# Patient Record
Sex: Female | Born: 1985 | Race: White | Hispanic: No | Marital: Married | Smoking: Former smoker
Health system: Southern US, Community
[De-identification: ages and names within clinical notes are randomized; demographics above are authoritative.]

## PROBLEM LIST (undated history)

## (undated) ENCOUNTER — Inpatient Hospital Stay (HOSPITAL_COMMUNITY): Payer: Self-pay

## (undated) DIAGNOSIS — M79605 Pain in left leg: Secondary | ICD-10-CM

## (undated) DIAGNOSIS — D649 Anemia, unspecified: Secondary | ICD-10-CM

## (undated) DIAGNOSIS — G43909 Migraine, unspecified, not intractable, without status migrainosus: Secondary | ICD-10-CM

## (undated) DIAGNOSIS — F32A Depression, unspecified: Secondary | ICD-10-CM

## (undated) DIAGNOSIS — R200 Anesthesia of skin: Secondary | ICD-10-CM

## (undated) DIAGNOSIS — H9190 Unspecified hearing loss, unspecified ear: Secondary | ICD-10-CM

## (undated) DIAGNOSIS — A749 Chlamydial infection, unspecified: Secondary | ICD-10-CM

## (undated) DIAGNOSIS — M545 Low back pain, unspecified: Secondary | ICD-10-CM

## (undated) DIAGNOSIS — F329 Major depressive disorder, single episode, unspecified: Secondary | ICD-10-CM

## (undated) DIAGNOSIS — F909 Attention-deficit hyperactivity disorder, unspecified type: Secondary | ICD-10-CM

## (undated) DIAGNOSIS — R87629 Unspecified abnormal cytological findings in specimens from vagina: Secondary | ICD-10-CM

## (undated) DIAGNOSIS — F419 Anxiety disorder, unspecified: Secondary | ICD-10-CM

## (undated) HISTORY — DX: Pain in left leg: M79.605

## (undated) HISTORY — DX: Unspecified abnormal cytological findings in specimens from vagina: R87.629

## (undated) HISTORY — PX: ADENOIDECTOMY, TONSILLECTOMY AND MYRINGOTOMY WITH TUBE PLACEMENT: SHX5716

## (undated) HISTORY — PX: HERNIA REPAIR: SHX51

## (undated) HISTORY — DX: Chlamydial infection, unspecified: A74.9

## (undated) HISTORY — DX: Migraine, unspecified, not intractable, without status migrainosus: G43.909

## (undated) HISTORY — DX: Attention-deficit hyperactivity disorder, unspecified type: F90.9

## (undated) HISTORY — PX: WISDOM TOOTH EXTRACTION: SHX21

## (undated) HISTORY — DX: Low back pain, unspecified: M54.50

## (undated) HISTORY — DX: Anemia, unspecified: D64.9

## (undated) HISTORY — PX: TONSILLECTOMY: SUR1361

## (undated) HISTORY — DX: Anesthesia of skin: R20.0

## (undated) HISTORY — PX: LEEP: SHX91

---
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
## 2014-02-21 ENCOUNTER — Ambulatory Visit (HOSPITAL_BASED_OUTPATIENT_CLINIC_OR_DEPARTMENT_OTHER)
Admission: RE | Admit: 2014-02-21 | Discharge: 2014-02-21 | Disposition: A | Discharge status: Home / Self Care | Payer: 59 | Source: Ambulatory Visit | Attending: Osteopathic Medicine | Admitting: Osteopathic Medicine

## 2014-02-21 ENCOUNTER — Other Ambulatory Visit (HOSPITAL_BASED_OUTPATIENT_CLINIC_OR_DEPARTMENT_OTHER): Payer: Self-pay | Admitting: Osteopathic Medicine

## 2014-02-21 DIAGNOSIS — R1084 Generalized abdominal pain: Secondary | ICD-10-CM

## 2014-05-28 DIAGNOSIS — R87613 High grade squamous intraepithelial lesion on cytologic smear of cervix (HGSIL): Secondary | ICD-10-CM | POA: Insufficient documentation

## 2016-07-24 DIAGNOSIS — F5081 Binge eating disorder: Secondary | ICD-10-CM | POA: Insufficient documentation

## 2016-07-24 DIAGNOSIS — F50814 Binge eating disorder, in remission: Secondary | ICD-10-CM | POA: Insufficient documentation

## 2016-07-24 DIAGNOSIS — F988 Other specified behavioral and emotional disorders with onset usually occurring in childhood and adolescence: Secondary | ICD-10-CM | POA: Insufficient documentation

## 2016-07-24 DIAGNOSIS — F17200 Nicotine dependence, unspecified, uncomplicated: Secondary | ICD-10-CM | POA: Insufficient documentation

## 2017-01-28 LAB — OB RESULTS CONSOLE ANTIBODY SCREEN: Antibody Screen: NEGATIVE

## 2017-01-28 LAB — OB RESULTS CONSOLE ABO/RH: RH TYPE: POSITIVE

## 2017-01-28 LAB — OB RESULTS CONSOLE RUBELLA ANTIBODY, IGM: RUBELLA: IMMUNE

## 2017-01-28 LAB — OB RESULTS CONSOLE HIV ANTIBODY (ROUTINE TESTING): HIV: NONREACTIVE

## 2017-01-28 LAB — OB RESULTS CONSOLE GC/CHLAMYDIA
Chlamydia: NEGATIVE
Gonorrhea: NEGATIVE

## 2017-01-28 LAB — OB RESULTS CONSOLE HEPATITIS B SURFACE ANTIGEN: HEP B S AG: NEGATIVE

## 2017-01-28 LAB — OB RESULTS CONSOLE RPR: RPR: NONREACTIVE

## 2017-02-20 ENCOUNTER — Emergency Department (HOSPITAL_BASED_OUTPATIENT_CLINIC_OR_DEPARTMENT_OTHER)
Admission: EM | Admit: 2017-02-20 | Discharge: 2017-02-20 | Disposition: A | Discharge status: Home / Self Care | Payer: 59 | Attending: Emergency Medicine | Admitting: Emergency Medicine

## 2017-02-20 ENCOUNTER — Encounter (HOSPITAL_BASED_OUTPATIENT_CLINIC_OR_DEPARTMENT_OTHER): Payer: Self-pay

## 2017-02-20 DIAGNOSIS — M542 Cervicalgia: Secondary | ICD-10-CM | POA: Diagnosis not present

## 2017-02-20 DIAGNOSIS — O9989 Other specified diseases and conditions complicating pregnancy, childbirth and the puerperium: Secondary | ICD-10-CM | POA: Insufficient documentation

## 2017-02-20 DIAGNOSIS — Z3A12 12 weeks gestation of pregnancy: Secondary | ICD-10-CM | POA: Diagnosis not present

## 2017-02-20 DIAGNOSIS — Z87891 Personal history of nicotine dependence: Secondary | ICD-10-CM | POA: Diagnosis not present

## 2017-02-20 HISTORY — DX: Anxiety disorder, unspecified: F41.9

## 2017-02-20 MED ORDER — CEPHALEXIN 500 MG PO CAPS: 500.0000 mg | ORAL_CAPSULE | Freq: Four times a day (QID) | ORAL | 0 refills | Status: DC

## 2017-02-20 NOTE — Discharge Instructions (Signed)
Please read and follow all provided instructions.  Your diagnoses today include:  1. Neck pain    Tests performed today include:  Vital signs. See below for your results today.   Medications prescribed:   Keflex (cephalexin) - antibiotic  You have been prescribed an antibiotic medicine: take the entire course of medicine even if you are feeling better. Stopping early can cause the antibiotic not to work.   Tylenol - medication for pain  Take any prescribed medications only as directed.  Home care instructions:  Follow any educational materials contained in this packet.  Use warm compresses on your neck for discomfort and pain.  If you develop redness and warmth in the skin or recurrent swelling, start antibiotic and follow-up with your doctor.  Follow-up instructions: Please follow-up with your primary care provider as needed for further evaluation of your symptoms.   Return instructions:   Please return to the Emergency Department if you experience worsening symptoms.   Return with fever, headache, vomiting, worsening neck pain.   Please return if you have any other emergent concerns.  Additional Information:  Your vital signs today were: BP 113/70    Pulse 90    Temp 98.6 F (37 C) (Oral)    Resp 16    Ht 5\' 3"  (1.6 m)    Wt 79.8 kg (176 lb)    SpO2 98%    BMI 31.18 kg/m  If your blood pressure (BP) was elevated above 135/85 this visit, please have this repeated by your doctor within one month. --------------

## 2017-02-20 NOTE — ED Notes (Signed)
ED Provider at bedside. 

## 2017-02-20 NOTE — ED Triage Notes (Signed)
C/o pain to posterior neck x 1 week-"knot" to area x today-states she was seen by PCP and sent to ED due to "blood in my ear"-NAD-steady gait

## 2017-02-20 NOTE — ED Provider Notes (Signed)
[facility]   CSN: [id number] Arrival date & time: 02/20/17  1532     History   Chief Complaint Chief Complaint  Patient presents with  . Neck Pain    HPI Linda Wiggins is a 31 y.o. female.  Patient who is [redacted] weeks pregnant, 2 prior pregnancies with no history of bleeding complications -- presents with c/o lump on the back of her neck with associated bruising and tenderness. Pt first noticed this today. She has had preceding soreness and thought that she 'slept on it wrong'. She states that yesterday she had fever to 99.93F and an episode of vomiting. She called her GYN today who referred her to PCP. PCP referred her to ED based on noting blood in her R ear. She reports no ear pain or drainage. She uses Q-tips but only on the outside. No other trauma reported. The swelling on the back of the neck is improved and the bruising is now gone. She has continued neck pain with decreased range of motion due to pain. Patient otherwise denies any weakness, numbness, or tingling in her arms or legs. No vision change or hearing change. No headaches or confusion. Patient denies vaginal bleeding, hematuria, hematochezia. She denies other easy bruising or bleeding. Patient has taken Tylenol prior to arrival. Onset of symptoms acute. Course is gradually improving.      Past Medical History:  Diagnosis Date  . Anxiety     There are no active problems to display for this patient.   Past Surgical History:  Procedure Laterality Date  . ADENOIDECTOMY, TONSILLECTOMY AND MYRINGOTOMY WITH TUBE PLACEMENT    . HERNIA REPAIR    . TUBAL LIGATION      OB History    Gravida Para Term Preterm AB Living   1             SAB TAB Ectopic Multiple Live Births                   Home Medications    Prior to Admission medications   Medication Sig Start Date End Date Taking? Authorizing Provider  FLUoxetine HCl (PROZAC PO) Take by mouth.   Yes [provider]    cephALEXin (KEFLEX) 500 MG capsule Take 1 capsule (500 mg total) by mouth 4 (four) times daily. 02/20/17   Renne CriglerGeiple, Avyon Herendeen, PA-C    Family History No family history on file.  Social History Social History  Substance Use Topics  . Smoking status: Former Games developermoker  . Smokeless tobacco: Never Used  . Alcohol use No     Allergies   Tramadol   Review of Systems Review of Systems  Constitutional: Negative for chills and fever (No documented fever greater than 100.93F).  HENT: Negative for congestion, ear discharge, ear pain, hearing loss, rhinorrhea and sore throat.   Eyes: Negative for redness.  Respiratory: Negative for cough.   Cardiovascular: Negative for chest pain.  Gastrointestinal: Negative for abdominal pain, diarrhea, nausea and vomiting.  Genitourinary: Negative for dysuria.  Musculoskeletal: Positive for myalgias, neck pain and neck stiffness.  Skin: Positive for color change. Negative for rash.  Neurological: Negative for headaches.     Physical Exam Updated Vital Signs BP 113/70   Pulse 90   Temp 98.6 F (37 C) (Oral)   Resp 16   Ht 5\' 3"  (1.6 m)   Wt 79.8 kg (176 lb)   SpO2 98%   BMI 31.18 kg/m   Physical Exam  Constitutional: She appears  well-developed and well-nourished.  HENT:  Head: Normocephalic and atraumatic.  Right Ear: Hearing, tympanic membrane and external ear normal. Tympanic membrane is not perforated, not erythematous and not bulging.  Left Ear: Hearing, tympanic membrane, external ear and ear canal normal. Tympanic membrane is not perforated, not erythematous and not bulging.  Mouth/Throat: Oropharynx is clear and moist.  There is a small erosion noted to the posterior wall of the right ear canal without any active bleeding or drainage. No signs of otitis externa. No hemotympanum or other abnormalities with either eardrum.  Eyes: Conjunctivae are normal. Right eye exhibits no discharge. Left eye exhibits no discharge.  Neck: Normal range of  motion. Neck supple.  Cardiovascular: Normal rate, regular rhythm and normal heart sounds.   Pulmonary/Chest: Effort normal and breath sounds normal.  Abdominal: Soft. There is no tenderness.  Musculoskeletal:       Right shoulder: Normal.       Left shoulder: Normal.       Cervical back: She exhibits decreased range of motion, tenderness and edema (Minimal). She exhibits no bony tenderness and no swelling.       Thoracic back: Normal.       Lumbar back: Normal.       Back:  Lymphadenopathy:    She has no cervical adenopathy.  Neurological: She is alert.  Skin: Skin is warm and dry.  Psychiatric: She has a normal mood and affect.  Nursing note and vitals reviewed.    ED Treatments / Results  Labs (all labs ordered are listed, but only abnormal results are displayed) Labs Reviewed - No data to display  EKG  EKG Interpretation None       Radiology No results found.  Procedures Procedures (including critical care time)  Medications Ordered in ED Medications - No data to display   Initial Impression / Assessment and Plan / ED Course  I have reviewed the triage vital signs and the nursing notes.  Pertinent labs & imaging results that were available during my care of the patient were reviewed by me and considered in my medical decision making (see chart for details).     Patient seen and examined.   Vital signs reviewed and are as follows: BP 113/70   Pulse 90   Temp 98.6 F (37 C) (Oral)   Resp 16   Ht 5\' 3"  (1.6 m)   Wt 79.8 kg (176 lb)   SpO2 98%   BMI 31.18 kg/m   Discussed with patient and husband that given current objective findings, do not feel extensive workup is indicated at this point.  At time of exam, no signs of cellulitis or abscess. I do recommend that the patient use warm compress or heating pad, Tylenol for irritation of the paraspinous musculature.  If she develops swelling or redness/warmth she is to start taking prescription of  Keflex. I do not feel that she needs to start this unless her symptoms worsen, as they currently appear to be improving based on a picture was taken earlier today.  Patient encouraged to follow-up with PCP/GYN as needed, especially if she develops other bleeding symptoms.  Encouraged return to the emergency department with any concerns. Patient and husband verbalized understanding and seem to be in agreement with the plan.  Final Clinical Impressions(s) / ED Diagnoses   Final diagnoses:  Neck pain   Patient with neck pain, skin changes, sent from PCP. At time of exam patient has tenderness but no significant skin findings to suggest  infection or abscess. Tenderness is suggestive of musculoskeletal irritation. She does show me a picture that demonstrated some bruising to the posterior neck that appears resolved. She does not have any other bleeding symptoms of her skin or elsewhere. She is in early 2nd trimester but do not suspect HELLP syndrome or other irregularity. Small erosion and ear canal suspect likely secondary to trauma. Do not suspect basilar skull fracture with minimal symptoms and lack of mechanism. Low suspicion for something threatening to the patient or her pregnancy at this time. We discussed signs and symptoms to return. Patient appears well, discharge. Symptomatic control indicated at this time.   New Prescriptions Discharge Medication List as of 02/20/2017  4:05 PM    START taking these medications   Details  cephALEXin (KEFLEX) 500 MG capsule Take 1 capsule (500 mg total) by mouth 4 (four) times daily., Starting Fri 02/20/2017, Print         Renne Crigler, PA-C 02/20/17 1624    Jerelyn Scott, MD 02/20/17 [phone]

## 2017-05-18 ENCOUNTER — Ambulatory Visit (INDEPENDENT_AMBULATORY_CARE_PROVIDER_SITE_OTHER): Payer: Medicaid Other | Admitting: Obstetrics & Gynecology

## 2017-05-18 ENCOUNTER — Encounter: Payer: Self-pay | Admitting: Obstetrics & Gynecology

## 2017-05-18 DIAGNOSIS — Z3482 Encounter for supervision of other normal pregnancy, second trimester: Secondary | ICD-10-CM

## 2017-05-18 DIAGNOSIS — Z3480 Encounter for supervision of other normal pregnancy, unspecified trimester: Secondary | ICD-10-CM

## 2017-05-18 NOTE — Progress Notes (Signed)
   PRENATAL VISIT NOTE  Subjective:  Linda Wiggins is a 31 y.o. G3P2002 at [redacted]w[redacted]d being seen today for ongoing prenatal care.  She is currently monitored for the following issues for this low-risk pregnancy and has Supervision of other normal pregnancy, antepartum on her problem list.  Patient reports selling.  Contractions: Not present. Vag. Bleeding: None.  Movement: Present. Denies leaking of fluid.   The following portions of the patient's history were reviewed and updated as appropriate: allergies, current medications, past family history, past medical history, past social history, past surgical history and problem list. Problem list updated.  Objective:   Vitals:   05/18/17 1419  BP: 120/70  Pulse: (!) 102  Weight: 191 lb (86.6 kg)    Fetal Status: Fetal Heart Rate (bpm): 146   Movement: Present     General:  Alert, oriented and cooperative. Patient is in no acute distress.  Skin: Skin is warm and dry. No rash noted.   Cardiovascular: Normal heart rate noted  Respiratory: Normal respiratory effort, no problems with respiration noted  Abdomen: Soft, gravid, appropriate for gestational age.  Pain/Pressure: Present     Pelvic: Cervical exam deferred        Extremities: Normal range of motion.  Edema: Mild pitting, slight indentation  Mental Status:  Normal mood and affect. Normal behavior. Normal judgment and thought content.   Assessment and Plan:  Pregnancy: G3P2002 at [redacted]w[redacted]d  1. Supervision of other normal pregnancy, antepartum Pt's BP is elevated for her although nml.  She feels like she is selling.  Can't wear rings.  Has headaches but has history of HA.  No HA today.  Worried that something is wrong.    2.  Undesired fertility BTL papers  3.  Incompete anatomy of heart F.U anatomy as planned by [facility]  Preterm labor symptoms and general obstetric precautions including but not limited to vaginal bleeding, contractions, leaking of fluid and fetal movement were  reviewed in detail with the patient. Please refer to After Visit Summary for other counseling recommendations.  No Follow-up on file.   Elsie Lincoln, MD

## 2017-05-19 ENCOUNTER — Telehealth: Payer: Self-pay

## 2017-05-19 NOTE — Telephone Encounter (Signed)
Pt called stating that her left arm has been numb for three weeks but, today she has also had a bad headache and her feet are swollen to the point that she cannot put her shoes on. I spoke with Lora Clark, RMariel Aloehe said to tell the pt to take some Tylenol, sit in a dark room and elevate her feet above the level of her heart. I told the pt to do that and told her to call us and let us know if it gets better and if it doesn't then we will bring her in the office to be seen. Pt stated that she did not have a way to check her BP at work but, she would do that and call us to let us know how she was feeling.

## 2017-05-20 ENCOUNTER — Encounter (HOSPITAL_COMMUNITY): Payer: Self-pay

## 2017-05-20 ENCOUNTER — Inpatient Hospital Stay (HOSPITAL_COMMUNITY)
Admission: AD | Admit: 2017-05-20 | Discharge: 2017-05-20 | Disposition: A | Discharge status: Home / Self Care | Payer: 59 | Source: Ambulatory Visit | Attending: Obstetrics and Gynecology | Admitting: Obstetrics and Gynecology

## 2017-05-20 DIAGNOSIS — O9989 Other specified diseases and conditions complicating pregnancy, childbirth and the puerperium: Secondary | ICD-10-CM

## 2017-05-20 DIAGNOSIS — R030 Elevated blood-pressure reading, without diagnosis of hypertension: Secondary | ICD-10-CM | POA: Diagnosis present

## 2017-05-20 DIAGNOSIS — Z3A25 25 weeks gestation of pregnancy: Secondary | ICD-10-CM | POA: Diagnosis not present

## 2017-05-20 DIAGNOSIS — R519 Headache, unspecified: Secondary | ICD-10-CM

## 2017-05-20 DIAGNOSIS — O26892 Other specified pregnancy related conditions, second trimester: Secondary | ICD-10-CM | POA: Diagnosis not present

## 2017-05-20 DIAGNOSIS — F17210 Nicotine dependence, cigarettes, uncomplicated: Secondary | ICD-10-CM | POA: Diagnosis not present

## 2017-05-20 DIAGNOSIS — O99332 Smoking (tobacco) complicating pregnancy, second trimester: Secondary | ICD-10-CM | POA: Diagnosis not present

## 2017-05-20 DIAGNOSIS — R51 Headache: Secondary | ICD-10-CM

## 2017-05-20 LAB — URINALYSIS, ROUTINE W REFLEX MICROSCOPIC
BILIRUBIN URINE: NEGATIVE
GLUCOSE, UA: NEGATIVE mg/dL
Hgb urine dipstick: NEGATIVE
Ketones, ur: NEGATIVE mg/dL
LEUKOCYTES UA: NEGATIVE
Nitrite: NEGATIVE
PH: 7 (ref 5.0–8.0)
Protein, ur: NEGATIVE mg/dL
Specific Gravity, Urine: 1.003 — ABNORMAL LOW (ref 1.005–1.030)

## 2017-05-20 NOTE — Progress Notes (Addendum)
G3P2 @ 25.[redacted] wksga. Presents to [facility] VSS see flow sheet for details. Denies LOF or bleeding. Pt c/o right abd pain that's been evaluated by provider on Monday and denies no new pain. Denies Ha, rib pain. Noted slight swelling of ankles that's been ongoing and nothing new for the patient. EFM applied.   Hx of 2 SVD no issues.   Provider at bs assessing pt. [facility]   1950: Pt declines lab work and wants to go home due to normal bp's. Provider made aware and at bs to reassess.  2003: Discharge instructions given with pt understanding. Pt left unit via ambulatory with SO

## 2017-05-20 NOTE — MAU Note (Signed)
Mon, was at the office, BP up- watching for pre-eclampsia. Today has been running 140/90's. Has a HA (for a wk, has not taken anything), RUQ pain started 3 days ago, visual changes, left arm is going numb. never had problems with BP before.

## 2017-05-20 NOTE — MAU Provider Note (Signed)
History     CSN: [id number]  Arrival date and time: 05/20/17 1842   None     Chief Complaint  Patient presents with  . Hypertension  . Headache  . Blurred Vision   31 yo G3P2002 at [redacted]w[redacted]d presents today because she had elevated blood pressure reading at [facility] today which was 141/77. Patient is very concerned that she has preeclampsia. She admits to headache, RUQ pain, and blurry vision. She was seen in office on Monday and had normal blood pressure at that time.     OB History    Gravida Para Term Preterm AB Living   3 2 2     2    SAB TAB Ectopic Multiple Live Births                  Past Medical History:  Diagnosis Date  . Anemia   . Anxiety   . Chlamydia   . Migraines   . Vaginal Pap smear, abnormal     Past Surgical History:  Procedure Laterality Date  . ADENOIDECTOMY, TONSILLECTOMY AND MYRINGOTOMY WITH TUBE PLACEMENT    . HERNIA REPAIR    . LEEP    . TONSILLECTOMY      Family History  Problem Relation Age of Onset  . Cervical cancer Mother   . Heart murmur Father   . Skin cancer Maternal Grandmother   . Skin cancer Maternal Grandfather   . Skin cancer Paternal Grandmother   . Skin cancer Paternal Grandfather   . Heart disease Paternal Grandfather     Social History  Substance Use Topics  . Smoking status: Light Tobacco Smoker    Packs/day: 0.25    Types: Cigarettes  . Smokeless tobacco: Never Used  . Alcohol use No     Comment: Not while pregnant    Allergies:  Allergies  Allergen Reactions  . Tramadol     Prescriptions Prior to Admission  Medication Sig Dispense Refill Last Dose  . FLUoxetine (PROZAC) 20 MG capsule   5 Taking  . FLUoxetine HCl (PROZAC PO) Take by mouth.   Not Taking  . ondansetron (ZOFRAN) 4 MG tablet Take 4 mg by mouth.   Not Taking  . Pediatric Multivit-Minerals-C (FLINTSTONES GUMMIES PLUS) CHEW Chew by mouth.   Taking  . sertraline (ZOLOFT) 50 MG tablet Take 50 mg by mouth.   Not Taking    Review of Systems   Constitutional: Negative for chills and fever.  HENT: Negative for congestion and ear discharge.   Eyes: Negative for discharge and itching.  Respiratory: Negative for chest tightness and shortness of breath.   Gastrointestinal: Positive for abdominal distention. Negative for vomiting.  Genitourinary: Negative for dysuria and flank pain.  Musculoskeletal: Negative for back pain and neck pain.  Neurological: Positive for headaches.   Physical Exam   Blood pressure 137/69, pulse (!) 103, temperature 98.5 F (36.9 C), temperature source Oral, resp. rate 18, height 5\' 3"  (1.6 m), weight 193 lb (87.5 kg), last menstrual period 11/25/2016, SpO2 98 %.  Physical Exam  Constitutional: She is oriented to person, place, and time. She appears well-developed and well-nourished. No distress.  HENT:  Head: Normocephalic and atraumatic.  Eyes: Pupils are equal, round, and reactive to light. Conjunctivae and EOM are normal.  Neck: Normal range of motion. Neck supple.  Cardiovascular: Intact distal pulses.   Respiratory: Effort normal. She has no wheezes.  GI: Soft. There is no tenderness.  Musculoskeletal: Normal range of motion. She exhibits no  edema.  Neurological: She is alert and oriented to person, place, and time.  Skin: Skin is warm and dry.  Psychiatric: She has a normal mood and affect. Her behavior is normal.    MAU Course  Procedures  MDM Blood pressure is normal today. I offered to perform lab studies to ease patient's mind. She declines labs today. Counseled her extensively about preeclampsia.  Assessment and Plan  1. Pregnancy at 25 weeks- follow up outpatient 2. Normal blood pressure in pregnancy- follow up outpatient.  Chubb Corporationmber Frazier Balfour 05/20/2017, 7:44 PM

## 2017-05-20 NOTE — Discharge Instructions (Signed)

## 2017-05-25 ENCOUNTER — Ambulatory Visit (INDEPENDENT_AMBULATORY_CARE_PROVIDER_SITE_OTHER): Payer: 59 | Admitting: *Deleted

## 2017-05-25 ENCOUNTER — Encounter: Payer: Self-pay | Admitting: *Deleted

## 2017-05-25 VITALS — BP 119/72 | HR 112

## 2017-05-25 DIAGNOSIS — Z3480 Encounter for supervision of other normal pregnancy, unspecified trimester: Secondary | ICD-10-CM

## 2017-05-25 NOTE — Progress Notes (Signed)
Pt here for BP check only  112/78

## 2017-06-01 ENCOUNTER — Ambulatory Visit (INDEPENDENT_AMBULATORY_CARE_PROVIDER_SITE_OTHER): Payer: 59 | Admitting: Advanced Practice Midwife

## 2017-06-01 ENCOUNTER — Ambulatory Visit (HOSPITAL_COMMUNITY)
Admission: RE | Admit: 2017-06-01 | Discharge: 2017-06-01 | Disposition: A | Discharge status: Home / Self Care | Payer: 59 | Source: Ambulatory Visit | Attending: Obstetrics & Gynecology | Admitting: Obstetrics & Gynecology

## 2017-06-01 ENCOUNTER — Encounter: Payer: Self-pay | Admitting: Advanced Practice Midwife

## 2017-06-01 ENCOUNTER — Other Ambulatory Visit: Payer: Self-pay | Admitting: Obstetrics & Gynecology

## 2017-06-01 VITALS — BP 128/76 | HR 97 | Wt 194.0 lb

## 2017-06-01 DIAGNOSIS — Z3A26 26 weeks gestation of pregnancy: Secondary | ICD-10-CM

## 2017-06-01 DIAGNOSIS — Z3480 Encounter for supervision of other normal pregnancy, unspecified trimester: Secondary | ICD-10-CM

## 2017-06-01 DIAGNOSIS — Z3689 Encounter for other specified antenatal screening: Secondary | ICD-10-CM

## 2017-06-01 DIAGNOSIS — Z23 Encounter for immunization: Secondary | ICD-10-CM | POA: Diagnosis not present

## 2017-06-01 DIAGNOSIS — Z3482 Encounter for supervision of other normal pregnancy, second trimester: Secondary | ICD-10-CM

## 2017-06-01 DIAGNOSIS — Z9889 Other specified postprocedural states: Secondary | ICD-10-CM

## 2017-06-01 DIAGNOSIS — R87613 High grade squamous intraepithelial lesion on cytologic smear of cervix (HGSIL): Secondary | ICD-10-CM

## 2017-06-01 DIAGNOSIS — Z3490 Encounter for supervision of normal pregnancy, unspecified, unspecified trimester: Secondary | ICD-10-CM

## 2017-06-01 NOTE — Patient Instructions (Signed)
Pregnancy and Influenza Influenza, also called the flu, is an infection of the respiratory tract. If you are pregnant, you are more likely to catch the flu. You are also more likely to have a more serious case of the flu. This is because pregnancy lowers your body's ability to fight off infections (it weakens your immune system). It also puts additional stress on your heart and lungs, which makes you more likely to have complications. Having a bad case of the flu, especially with a high fever, can be dangerous for your developing baby. It can cause you to go into early labor. How do people get the flu? The flu is caused by the influenza virus. This virus is common every year in the fall and winter. It spreads when virus particles get passed from person to person. You can get the virus if you are near a sick person who is coughing or sneezing. You can also get the virus if you touch something that has the virus on it and then touch your face. How can I protect myself against the flu?  Get a flu shot. The best way to prevent the flu is to get a flu shot before flu season starts. The flu shot is not dangerous for your developing baby. It may even help protect your baby from the flu for up to 6 months after birth. The flu shot is one type of flu vaccine. Another type is a nasal spray vaccine. Do not get the nasal spray vaccine. It is not approved for pregnancy.  Do not come in close contact with sick people.  Do not share food, drinks, or utensils with other people.  Wash your hands often. Use hand sanitizer when soap and water are not available. What should I do if I have flu symptoms? If you have any flu symptoms, call your health care provider right away. Flu symptoms include:  Fever or chills.  Muscle aches.  Headache.  Sore throat.  Nasal congestion.  Cough.  Feeling tired.  Loss of appetite.  Vomiting.  Diarrhea.  You may be able to take an antiviral medicine to keep the flu  from becoming severe and to shorten how long it lasts. What should I do at home if I am diagnosed with the flu?  Do not take any medicine, including cold or flu medicine, unless directed by your health care provider.  If you take antiviral medicine, make sure you finish it even if you start to feel better.  Drink enough fluid to keep your urine clear or pale yellow.  Get plenty of rest. When would I seek immediate medical care if I have the flu?  You have trouble breathing.  You have chest pain.  You begin to have labor pains.  You have a high fever that does not go down after you take medicine.  You do not feel your baby move.  You have diarrhea or vomiting that will not go away. This information is not intended to replace advice given to you by your health care provider. Make sure you discuss any questions you have with your health care provider. Document Released: 05/23/2008 Document Revised: 12/27/2015 Document Reviewed: 06/17/2013 Elsevier Interactive Patient Education  2017 [employer].   Preterm Labor and Birth Information The normal length of a pregnancy is 39-41 weeks. Preterm labor is when labor starts before 37 completed weeks of pregnancy. What are the risk factors for preterm labor? Preterm labor is more likely to occur in women who:  Have certain infections during pregnancy such as a bladder infection, sexually transmitted infection, or infection inside the uterus (chorioamnionitis).  Have a shorter-than-normal cervix.  Have gone into preterm labor before.  Have had surgery on their cervix.  Are younger than age 30 or older than age 30.  Are African American.  Are pregnant with twins or multiple babies (multiple gestation).  Take street drugs or smoke while pregnant.  Do not gain enough weight while pregnant.  Became pregnant shortly after having been pregnant.  What are the symptoms of preterm labor? Symptoms of preterm labor include:  Cramps  similar to those that can happen during a menstrual period. The cramps may happen with diarrhea.  Pain in the abdomen or lower back.  Regular uterine contractions that may feel like tightening of the abdomen.  A feeling of increased pressure in the pelvis.  Increased watery or bloody mucus discharge from the vagina.  Water breaking (ruptured amniotic sac).  Why is it important to recognize signs of preterm labor? It is important to recognize signs of preterm labor because babies who are born prematurely may not be fully developed. This can put them at an increased risk for:  Long-term (chronic) heart and lung problems.  Difficulty immediately after birth with regulating body systems, including blood sugar, body temperature, heart rate, and breathing rate.  Bleeding in the brain.  Cerebral palsy.  Learning difficulties.  Death.  These risks are highest for babies who are born before 34 weeks of pregnancy. How is preterm labor treated? Treatment depends on the length of your pregnancy, your condition, and the health of your baby. It may involve:  Having a stitch (suture) placed in your cervix to prevent your cervix from opening too early (cerclage).  Taking or being given medicines, such as: ? Hormone medicines. These may be given early in pregnancy to help support the pregnancy. ? Medicine to stop contractions. ? Medicines to help mature the baby's lungs. These may be prescribed if the risk of delivery is high. ? Medicines to prevent your baby from developing cerebral palsy.  If the labor happens before 34 weeks of pregnancy, you may need to stay in the hospital. What should I do if I think I am in preterm labor? If you think that you are going into preterm labor, call your health care provider right away. How can I prevent preterm labor in future pregnancies? To increase your chance of having a full-term pregnancy:  Do not use any tobacco products, such as cigarettes,  chewing tobacco, and e-cigarettes. If you need help quitting, ask your health care provider.  Do not use street drugs or medicines that have not been prescribed to you during your pregnancy.  Talk with your health care provider before taking any herbal supplements, even if you have been taking them regularly.  Make sure you gain a healthy amount of weight during your pregnancy.  Watch for infection. If you think that you might have an infection, get it checked right away.  Make sure to tell your health care provider if you have gone into preterm labor before.  This information is not intended to replace advice given to you by your health care provider. Make sure you discuss any questions you have with your health care provider. Document Released: 10/11/2003 Document Revised: 01/01/2016 Document Reviewed: 12/12/2015 Elsevier Interactive Patient Education  2018 [employer]

## 2017-06-01 NOTE — Progress Notes (Signed)
   PRENATAL VISIT NOTE  Subjective:  Linda Wiggins is a 31 y.o. G3P2002 at 3353w6d being seen today for ongoing prenatal care.  She is currently monitored for the following issues for this low-risk pregnancy and has Supervision of other normal pregnancy, antepartum on her problem list. Hx LEEP.   Patient reports fatigue. States she does not like being pregnant and is frustrated by low energy and weight gain. Contractions: Not present. Vag. Bleeding: None.  Movement: Present. Denies leaking of fluid.   The following portions of the patient's history were reviewed and updated as appropriate: allergies, current medications, past family history, past medical history, past social history, past surgical history and problem list. Problem list updated.  Objective:   Vitals:   06/01/17 0833  BP: 128/76  Pulse: 97  Weight: 194 lb (88 kg)    Fetal Status: Fetal Heart Rate (bpm): 141   Movement: Present     General:  Alert, oriented and cooperative. Patient is in no acute distress.  Psych Flat affect  Skin: Skin is warm and dry. No rash noted.   Cardiovascular: Normal heart rate noted  Respiratory: Normal respiratory effort, no problems with respiration noted  Abdomen: Soft, gravid, appropriate for gestational age.  Pain/Pressure: Present     Pelvic: Cervical exam deferred        Extremities: Normal range of motion.  Edema: Mild pitting, slight indentation  Mental Status:  Normal mood and affect. Normal behavior. Normal judgment and thought content.   Assessment and Plan:  Pregnancy: G3P2002 at 4653w6d  1. Encounter for supervision of normal pregnancy, antepartum, unspecified gravidity  - HIV antibody (with reflex) - CBC - RPR - 2Hr GTT w/ 1 Hr Carpenter 75 g - Tdap vaccine greater than or equal to 7yo IM  2. Needs flu shot  - Flu Vaccine QUAD 36+ mos IM  3. Supervision of other normal pregnancy, antepartum  4. GAD/?Depression  - Continue Prozac - Recommend developing a walking  routine to improve mood, decrease weight gain, improve energy. Pt doesn't think she has time. Suggested walking during kid's after school activities. Support given.   Preterm labor symptoms and general obstetric precautions including but not limited to vaginal bleeding, contractions, leaking of fluid and fetal movement were reviewed in detail with the patient. Please refer to After Visit Summary for other counseling recommendations.  F/U 2 weeks   Dorathy KinsmanVirginia Marciano Mundt, [place]

## 2017-06-02 LAB — 2HR GTT W 1 HR, CARPENTER, 75 G
GLUCOSE, 1 HR, GEST: 152 mg/dL (ref 65–179)
GLUCOSE, FASTING, GEST: 78 mg/dL (ref 65–91)
Glucose, 2 Hr, Gest: 124 mg/dL (ref 65–152)

## 2017-06-02 LAB — CBC
HCT: 29.5 % — ABNORMAL LOW (ref 35.0–45.0)
Hemoglobin: 10.1 g/dL — ABNORMAL LOW (ref 11.7–15.5)
MCH: 30.4 pg (ref 27.0–33.0)
MCHC: 34.2 g/dL (ref 32.0–36.0)
MCV: 88.9 fL (ref 80.0–100.0)
MPV: 8.9 fL (ref 7.5–12.5)
Platelets: 294 10*3/uL (ref 140–400)
RBC: 3.32 10*6/uL — ABNORMAL LOW (ref 3.80–5.10)
RDW: 12.4 % (ref 11.0–15.0)
WBC: 10.3 10*3/uL (ref 3.8–10.8)

## 2017-06-02 LAB — RPR: RPR: NONREACTIVE

## 2017-06-02 LAB — HIV ANTIBODY (ROUTINE TESTING W REFLEX): HIV: NONREACTIVE

## 2017-06-03 ENCOUNTER — Telehealth: Payer: Self-pay

## 2017-06-03 NOTE — Telephone Encounter (Signed)
Pt called stating that she has had contractions every 8-12 minutes for two hours. After speaking to Armandina StammerJennifer Howard, RN we told pt that we didn't have a doctor in the office right now and we think [facility] should [facility] and may need IV fluids. Pt stated that she would give it more time and if it didn't get better then she would go to [facility].

## 2017-06-04 ENCOUNTER — Emergency Department (INDEPENDENT_AMBULATORY_CARE_PROVIDER_SITE_OTHER)
Admission: EM | Admit: 2017-06-04 | Discharge: 2017-06-04 | Disposition: A | Discharge status: Home / Self Care | Payer: 59 | Source: Home / Self Care | Attending: Emergency Medicine | Admitting: Emergency Medicine

## 2017-06-04 ENCOUNTER — Encounter: Payer: Self-pay | Admitting: *Deleted

## 2017-06-04 ENCOUNTER — Telehealth: Payer: Self-pay

## 2017-06-04 DIAGNOSIS — J069 Acute upper respiratory infection, unspecified: Secondary | ICD-10-CM

## 2017-06-04 DIAGNOSIS — B9789 Other viral agents as the cause of diseases classified elsewhere: Secondary | ICD-10-CM

## 2017-06-04 DIAGNOSIS — J028 Acute pharyngitis due to other specified organisms: Principal | ICD-10-CM

## 2017-06-04 LAB — POCT INFLUENZA A/B
Influenza A, POC: NEGATIVE
Influenza B, POC: NEGATIVE

## 2017-06-04 LAB — POCT RAPID STREP A (OFFICE): Rapid Strep A Screen: NEGATIVE

## 2017-06-04 NOTE — Telephone Encounter (Signed)
Spoke with pt and she is aware that she passed her 2 hour GTT 

## 2017-06-04 NOTE — ED Provider Notes (Signed)
[facility]    CSN: [id number] Arrival date & time: 06/04/17  0912     History   Chief Complaint Chief Complaint  Patient presents with  . Cough    HPI Rajanae Mantia is a 31 y.o. female.   HPI Onset 24 hours ago. Nonproductive cough, fever to 100.5 without chills or sweats, Associated with severe sore throat, chest hurts when she coughs but no exertional chest pain. She states she feels this is a "cold", but because she is pregnant, [facility] 08/31/2017, she requests to be checked to see if any antibiotic or antiviral would be indicated at this time. She states pregnancy is going well. Her OB[facility] at [facility]. She smokes a half a pack a day and although I politely advised her to quit, and explain risks of not doing so, she states she is not willing to quit smoking at this time.  No history of recent tick bite. Has tried Tylenol, which helps to reduce fever and helps with pain.  Review of Systems: Positive for fatigue, mild nasal congestion, with clearish rhinorrhea with scant blood. No nosebleed. Positive for severe sore throat. Nonproductive cough. No shortness of her wheezing or exertional chest pain or shortness of breath.  Negative for acute vision changes, stiff neck, focal weakness, syncope, seizures, respiratory distress, abdominal pain, vomiting, diarrhea, GU symptoms, new rash.  Past Medical History:  Diagnosis Date  . Anemia   . Anxiety   . Chlamydia   . Migraines   . Vaginal Pap smear, abnormal     Patient Active Problem List   Diagnosis Date Noted  . H/O LEEP 06/01/2017  . Supervision of other normal pregnancy, antepartum 05/18/2017  . 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 Tobacco use disorder 07/24/2016  . Mild binge-eating disorder, in full remission 07/24/2016  . 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 ADENOIDECTOMY, TONSILLECTOMY AND  MYRINGOTOMY WITH TUBE PLACEMENT    . HERNIA REPAIR    . LEEP    . TONSILLECTOMY      OB History    Gravida Para Term Preterm AB Living   3 2 2     2    SAB TAB Ectopic Multiple Live Births                   Home Medications    Prior to Admission medications   Medication Sig Start Date End Date Taking? Authorizing Provider  FLUoxetine (PROZAC) 20 MG capsule  04/17/17   [provider]  ondansetron (ZOFRAN) 4 MG tablet Take 4 mg by mouth. 01/14/17 01/14/18  [provider]  Pediatric Multivit-Minerals-C (FLINTSTONES GUMMIES PLUS) CHEW Chew by mouth.    [provider]    Family History Family History  Problem Relation Age of Onset  . Cervical cancer Mother   . Heart murmur Father   . Skin cancer Maternal Grandmother   . Skin cancer Maternal Grandfather   . Skin cancer Paternal Grandmother   . Skin cancer Paternal Grandfather   . Heart disease Paternal Grandfather     Social History Social History  Substance Use Topics  . Smoking status: Current Every Day Smoker    Packs/day: 0.50    Types: Cigarettes  . Smokeless tobacco: Never Used  . Alcohol use No     Comment: Not while pregnant     Allergies   Tramadol   Review of Systems Review  of Systems  All other systems reviewed and are negative.    Physical Exam Triage Vital Signs ED Triage Vitals  Enc Vitals Group     BP 06/04/17 0932 109/73     Pulse Rate 06/04/17 0932 (!) 111     Resp 06/04/17 0932 18     Temp 06/04/17 0932 98.5 F (36.9 C)     Temp Source 06/04/17 0932 Oral     SpO2 06/04/17 0932 99 %     Weight 06/04/17 0932 196 lb (88.9 kg)     Height --      Head Circumference --      Peak Flow --      Pain Score 06/04/17 0933 4     Pain Loc --      Pain Edu? --      Excl. in GC? --    No data found.   Updated Vital Signs BP 109/73 (BP Location: Left Arm)   Pulse (!) 111   Temp 98.5 F (36.9 C) (Oral)   Resp 18   Wt 196 lb (88.9 kg)   LMP 11/25/2016   SpO2  99%   BMI 34.72 kg/m   Visual Acuity Right Eye Distance:   Left Eye Distance:   Bilateral Distance:    Right Eye Near:   Left Eye Near:    Bilateral Near:     Physical Exam  Constitutional: She appears well-developed and well-nourished. No distress.  Occasional nonproductive cough noted. No cardiorespiratory distress.  HENT:  Head: Normocephalic.  Oropharynx: Absent tonsils. Posterior pharynx moderately red without exudate. Airway intact Nose: Boggy nasal turbinates, mild serous drainage and congestion. No blood. Ears: TMs normal  Eyes: Conjunctivae are normal. Right eye exhibits no discharge. Left eye exhibits no discharge. No scleral icterus.  Neck: Neck supple. No JVD present.  Shoddy, minimally tender anterior cervical nodes  Cardiovascular: Regular rhythm and normal heart sounds.   No murmur heard. Pulmonary/Chest: Effort normal and breath sounds normal. She has no wheezes. She has no rales.  Abdominal: There is no tenderness.  Gravid  Musculoskeletal: Normal range of motion. She exhibits no edema.  Neurological: No cranial nerve deficit.  Skin: Skin is warm and dry. Capillary refill takes less than 2 seconds. No rash noted. She is not diaphoretic.  Psychiatric: She has a normal mood and affect.  Pulse repeated, 102   UC Treatments / Results  Labs (all labs ordered are listed, but only abnormal results are displayed) Labs Reviewed  POCT RAPID STREP A (OFFICE)  POCT INFLUENZA A/B   Rapid strep test negative EKG  EKG Interpretation None       Radiology No results found.  Procedures Procedures (including critical care time)  Medications Ordered in UC Medications - No data to display   Initial Impression / Assessment and Plan / UC Course  I have reviewed the triage vital signs and the nursing notes.  Pertinent labs & imaging results that were available during my care of patient were reviewed by me and considered in my medical decision making (see  chart for details).     Rapid strep test negative. Will send off strep culture. Rapid flu test is negative for influenza A and B We discussed, and I explained she likely has viral URI and sore throat. No evidence for bacterial infection. She agrees that no antiviral med or antibiotic indicated at this time.  We discussed symptomatic care with rest, vaporizer, push fluids. Tylenol if needed for pain or fever. Other  nonpharmacologic methods discussed. Advised her to always check with her OB/GYN for taking any medicines. Red flags and precautions discussed. If any worsening symptoms, follow-up here or OB/GYN or emergency room. She voiced understanding and agreement with above Final Clinical Impressions(s) / UC Diagnoses   Final diagnoses:  Viral URI with cough  Sore throat (viral)    New Prescriptions- none   Lajean ManesMassey, David, MD 06/04/17 1023

## 2017-06-04 NOTE — ED Triage Notes (Signed)
Patient c/o 2 days of non-productive cough, sore throat and chest pain with coughing. Taking Tylenol otc. Currently smoking 1/2 PPD. Currently 6 1/2 months gestation.

## 2017-06-05 ENCOUNTER — Telehealth: Payer: Self-pay | Admitting: *Deleted

## 2017-06-05 LAB — STREP A DNA PROBE: Group A Strep Probe: NOT DETECTED

## 2017-06-05 NOTE — Telephone Encounter (Signed)
Patient notified [facility]. Encouraged to treat symptomatically.

## 2017-06-15 ENCOUNTER — Ambulatory Visit (INDEPENDENT_AMBULATORY_CARE_PROVIDER_SITE_OTHER): Payer: 59 | Admitting: Obstetrics & Gynecology

## 2017-06-15 VITALS — BP 129/77 | HR 108 | Wt 200.0 lb

## 2017-06-15 DIAGNOSIS — G44221 Chronic tension-type headache, intractable: Secondary | ICD-10-CM | POA: Diagnosis not present

## 2017-06-15 DIAGNOSIS — Z3481 Encounter for supervision of other normal pregnancy, first trimester: Secondary | ICD-10-CM | POA: Diagnosis not present

## 2017-06-15 DIAGNOSIS — Z3480 Encounter for supervision of other normal pregnancy, unspecified trimester: Secondary | ICD-10-CM

## 2017-06-15 NOTE — Progress Notes (Signed)
Pt states that she has constant headaches & stomach pain everyday.     PRENATAL VISIT NOTE  Subjective:  Linda Wiggins is a 31 y.o. G3P2002 at 4424w6d being seen today for ongoing prenatal care.  She is currently monitored for the following issues for this low-risk pregnancy and has Supervision of other normal pregnancy, antepartum; Attention deficit disorder (ADD) without hyperactivity; HGSIL on cytologic smear of cervix; Tobacco use disorder; Mild binge-eating disorder, in full remission; and H/O LEEP on their problem list.  Patient reports continuous headache and pelvic pressure continuous..  Contractions: Irritability. Vag. Bleeding: None.  Movement: Present. Denies leaking of fluid.   The following portions of the patient's history were reviewed and updated as appropriate: allergies, current medications, past family history, past medical history, past social history, past surgical history and problem list. Problem list updated.  Objective:   Vitals:   06/15/17 1429  BP: 129/77  Pulse: (!) 108  Weight: 200 lb (90.7 kg)    Fetal Status: Fetal Heart Rate (bpm): 148 Fundal Height: 32 cm Movement: Present     General:  Alert, oriented and cooperative. Patient is in no acute distress.  Skin: Skin is warm and dry. No rash noted.   Cardiovascular: Normal heart rate noted  Respiratory: Normal respiratory effort, no problems with respiration noted  Abdomen: Soft, gravid, appropriate for gestational age.  Pain/Pressure: Present     Pelvic: Cervical exam performed Dilation: Closed Effacement (%): Thick Station: Ballotable  Extremities: Normal range of motion.  Edema: Mild pitting, slight indentation  Mental Status:  Normal mood and affect. Normal behavior. Normal judgment and thought content.   Assessment and Plan:  Pregnancy: G3P2002 at 7324w6d  1. Supervision of other normal pregnancy, antepartum -pain at pubic symphysis; no evidence of preterm labor--cervix closed  2.  Daily  headache Pt has frontal headaches above eyebrows Pt also had right next stiffness +muschle tension and knot superior to right scaula  Trigger point injection into muscle aove right scapula (at the heart of her skin tattoo) of 2 cc lidocaine/marcane 1:1 solution (no methylprednisilone available today).  Pt tolerated injection well Biofreeze Pregnancy massage Refer to karen Glyn Adeeague Clark or Dr. Catalina LungerHagan (pt may choose which provider)  Preterm labor symptoms and general obstetric precautions including but not limited to vaginal bleeding, contractions, leaking of fluid and fetal movement were reviewed in detail with the patient. Please refer to After Visit Summary for other counseling recommendations.  Return in about 2 weeks (around 06/29/2017).   Elsie LincolnKelly Rihanna Marseille, MD

## 2017-06-29 ENCOUNTER — Ambulatory Visit (INDEPENDENT_AMBULATORY_CARE_PROVIDER_SITE_OTHER): Payer: 59 | Admitting: Advanced Practice Midwife

## 2017-06-29 VITALS — BP 112/72 | HR 100 | Temp 97.6°F | Wt 202.0 lb

## 2017-06-29 DIAGNOSIS — J0111 Acute recurrent frontal sinusitis: Secondary | ICD-10-CM

## 2017-06-29 DIAGNOSIS — Z3480 Encounter for supervision of other normal pregnancy, unspecified trimester: Secondary | ICD-10-CM

## 2017-06-29 DIAGNOSIS — J069 Acute upper respiratory infection, unspecified: Secondary | ICD-10-CM

## 2017-06-29 DIAGNOSIS — O26843 Uterine size-date discrepancy, third trimester: Secondary | ICD-10-CM

## 2017-06-29 MED ORDER — AZITHROMYCIN 250 MG PO TABS: ORAL_TABLET | ORAL | 0 refills | Status: DC

## 2017-06-29 NOTE — Patient Instructions (Signed)
Third Trimester of Pregnancy The third trimester is from week 28 through week 40 (months 7 through 9). The third trimester is a time when the unborn baby (fetus) is growing rapidly. At the end of the ninth month, the fetus is about 20 inches in length and weighs 6-10 pounds. Body changes during your third trimester Your body will continue to go through many changes during pregnancy. The changes vary from woman to woman. During the third trimester:  Your weight will continue to increase. You can expect to gain 25-35 pounds (11-16 kg) by the end of the pregnancy.  You may begin to get stretch marks on your hips, abdomen, and breasts.  You may urinate more often because the fetus is moving lower into your pelvis and pressing on your bladder.  You may develop or continue to have heartburn. This is caused by increased hormones that slow down muscles in the digestive tract.  You may develop or continue to have constipation because increased hormones slow digestion and cause the muscles that push waste through your intestines to relax.  You may develop hemorrhoids. These are swollen veins (varicose veins) in the rectum that can itch or be painful.  You may develop swollen, bulging veins (varicose veins) in your legs.  You may have increased body aches in the pelvis, back, or thighs. This is due to weight gain and increased hormones that are relaxing your joints.  You may have changes in your hair. These can include thickening of your hair, rapid growth, and changes in texture. Some women also have hair loss during or after pregnancy, or hair that feels dry or thin. Your hair will most likely return to normal after your baby is born.  Your breasts will continue to grow and they will continue to become tender. A yellow fluid (colostrum) may leak from your breasts. This is the first milk you are producing for your baby.  Your belly button may stick out.  You may notice more swelling in your hands,  face, or ankles.  You may have increased tingling or numbness in your hands, arms, and legs. The skin on your belly may also feel numb.  You may feel short of breath because of your expanding uterus.  You may have more problems sleeping. This can be caused by the size of your belly, increased need to urinate, and an increase in your body's metabolism.  You may notice the fetus "dropping," or moving lower in your abdomen (lightening).  You may have increased vaginal discharge.  You may notice your joints feel loose and you may have pain around your pelvic bone.  What to expect at prenatal visits You will have prenatal exams every 2 weeks until week 36. Then you will have weekly prenatal exams. During a routine prenatal visit:  You will be weighed to make sure you and the baby are growing normally.  Your blood pressure will be taken.  Your abdomen will be measured to track your baby's growth.  The fetal heartbeat will be listened to.  Any test results from the previous visit will be discussed.  You may have a cervical check near your due date to see if your cervix has softened or thinned (effaced).  You will be tested for Group B streptococcus. This happens between 35 and 37 weeks.  Your health care provider may ask you:  What your birth plan is.  How you are feeling.  If you are feeling the baby move.  If you have had   any abnormal symptoms, such as leaking fluid, bleeding, severe headaches, or abdominal cramping.  If you are using any tobacco products, including cigarettes, chewing tobacco, and electronic cigarettes.  If you have any questions.  Other tests or screenings that may be performed during your third trimester include:  Blood tests that check for low iron levels (anemia).  Fetal testing to check the health, activity level, and growth of the fetus. Testing is done if you have certain medical conditions or if there are problems during the  pregnancy.  Nonstress test (NST). This test checks the health of your baby to make sure there are no signs of problems, such as the baby not getting enough oxygen. During this test, a belt is placed around your belly. The baby is made to move, and its heart rate is monitored during movement.  What is false labor? False labor is a condition in which you feel small, irregular tightenings of the muscles in the womb (contractions) that usually go away with rest, changing position, or drinking water. These are called Braxton Hicks contractions. Contractions may last for hours, days, or even weeks before true labor sets in. If contractions come at regular intervals, become more frequent, increase in intensity, or become painful, you should see your health care provider. What are the signs of labor?  Abdominal cramps.  Regular contractions that start at 10 minutes apart and become stronger and more frequent with time.  Contractions that start on the top of the uterus and spread down to the lower abdomen and back.  Increased pelvic pressure and dull back pain.  A watery or bloody mucus discharge that comes from the vagina.  Leaking of amniotic fluid. This is also known as your "water breaking." It could be a slow trickle or a gush. Let your health care provider know if it has a color or strange odor. If you have any of these signs, call your health care provider right away, even if it is before your due date. Follow these instructions at home: Medicines  Follow your health care provider's instructions regarding medicine use. Specific medicines may be either safe or unsafe to take during pregnancy.  Take a prenatal vitamin that contains at least 600 micrograms (mcg) of folic acid.  If you develop constipation, try taking a stool softener if your health care provider approves. Eating and drinking  Eat a balanced diet that includes fresh fruits and vegetables, whole grains, good sources of protein  such as meat, eggs, or tofu, and low-fat dairy. Your health care provider will help you determine the amount of weight gain that is right for you.  Avoid raw meat and uncooked cheese. These carry germs that can cause birth defects in the baby.  If you have low calcium intake from food, talk to your health care provider about whether you should take a daily calcium supplement.  Eat four or five small meals rather than three large meals a day.  Limit foods that are high in fat and processed sugars, such as fried and sweet foods.  To prevent constipation: ? Drink enough fluid to keep your urine clear or pale yellow. ? Eat foods that are high in fiber, such as fresh fruits and vegetables, whole grains, and beans. Activity  Exercise only as directed by your health care provider. Most women can continue their usual exercise routine during pregnancy. Try to exercise for 30 minutes at least 5 days a week. Stop exercising if you experience uterine contractions.  Avoid heavy   lifting.  Do not exercise in extreme heat or humidity, or at high altitudes.  Wear low-heel, comfortable shoes.  Practice good posture.  You may continue to have sex unless your health care provider tells you otherwise. Relieving pain and discomfort  Take frequent breaks and rest with your legs elevated if you have leg cramps or low back pain.  Take warm sitz baths to soothe any pain or discomfort caused by hemorrhoids. Use hemorrhoid cream if your health care provider approves.  Wear a good support bra to prevent discomfort from breast tenderness.  If you develop varicose veins: ? Wear support pantyhose or compression stockings as told by your healthcare provider. ? Elevate your feet for 15 minutes, 3-4 times a day. Prenatal care  Write down your questions. Take them to your prenatal visits.  Keep all your prenatal visits as told by your health care provider. This is important. Safety  Wear your seat belt at  all times when driving.  Make a list of emergency phone numbers, including numbers for family, friends, the hospital, and police and fire departments. General instructions  Avoid cat litter boxes and soil used by cats. These carry germs that can cause birth defects in the baby. If you have a cat, ask someone to clean the litter box for you.  Do not travel far distances unless it is absolutely necessary and only with the approval of your health care provider.  Do not use hot tubs, steam rooms, or saunas.  Do not drink alcohol.  Do not use any products that contain nicotine or tobacco, such as cigarettes and e-cigarettes. If you need help quitting, ask your health care provider.  Do not use any medicinal herbs or unprescribed drugs. These chemicals affect the formation and growth of the baby.  Do not douche or use tampons or scented sanitary pads.  Do not cross your legs for long periods of time.  To prepare for the arrival of your baby: ? Take prenatal classes to understand, practice, and ask questions about labor and delivery. ? Make a trial run to the hospital. ? Visit the hospital and tour the maternity area. ? Arrange for maternity or paternity leave through employers. ? Arrange for family and friends to take care of pets while you are in the hospital. ? Purchase a rear-facing car seat and make sure you know how to install it in your car. ? Pack your hospital bag. ? Prepare the baby's nursery. Make sure to remove all pillows and stuffed animals from the baby's crib to prevent suffocation.  Visit your dentist if you have not gone during your pregnancy. Use a soft toothbrush to brush your teeth and be gentle when you floss. Contact a health care provider if:  You are unsure if you are in labor or if your water has broken.  You become dizzy.  You have mild pelvic cramps, pelvic pressure, or nagging pain in your abdominal area.  You have lower back pain.  You have persistent  nausea, vomiting, or diarrhea.  You have an unusual or bad smelling vaginal discharge.  You have pain when you urinate. Get help right away if:  Your water breaks before 37 weeks.  You have regular contractions less than 5 minutes apart before 37 weeks.  You have a fever.  You are leaking fluid from your vagina.  You have spotting or bleeding from your vagina.  You have severe abdominal pain or cramping.  You have rapid weight loss or weight gain.    You have shortness of breath with chest pain.  You notice sudden or extreme swelling of your face, hands, ankles, feet, or legs.  Your baby makes fewer than 10 movements in 2 hours.  You have severe headaches that do not go away when you take medicine.  You have vision changes. Summary  The third trimester is from week 28 through week 40, months 7 through 9. The third trimester is a time when the unborn baby (fetus) is growing rapidly.  During the third trimester, your discomfort may increase as you and your baby continue to gain weight. You may have abdominal, leg, and back pain, sleeping problems, and an increased need to urinate.  During the third trimester your breasts will keep growing and they will continue to become tender. A yellow fluid (colostrum) may leak from your breasts. This is the first milk you are producing for your baby.  False labor is a condition in which you feel small, irregular tightenings of the muscles in the womb (contractions) that eventually go away. These are called Braxton Hicks contractions. Contractions may last for hours, days, or even weeks before true labor sets in.  Signs of labor can include: abdominal cramps; regular contractions that start at 10 minutes apart and become stronger and more frequent with time; watery or bloody mucus discharge that comes from the vagina; increased pelvic pressure and dull back pain; and leaking of amniotic fluid. This information is not intended to replace advice  given to you by your health care provider. Make sure you discuss any questions you have with your health care provider. Document Released: 07/15/2001 Document Revised: 12/27/2015 Document Reviewed: 09/21/2012 Elsevier Interactive Patient Education  2017 [employer]  

## 2017-06-29 NOTE — Progress Notes (Signed)
   PRENATAL VISIT NOTE  Subjective:  Linda Wiggins is a 31 y.o. G3P2002 at 4884w6d being seen today for ongoing prenatal care.  She is currently monitored for the following issues for this low-risk pregnancy and has Supervision of other normal pregnancy, antepartum; Attention deficit disorder (ADD) without hyperactivity; HGSIL on cytologic smear of cervix; Tobacco use disorder; Mild binge-eating disorder, in full remission; and H/O LEEP on their problem list.  Patient reports fatigue/poor sleep, upper respiratory infection x 7-8 days with sinus pressure, thick nasal mucus, sore throat from drainage, with no fever/chills..  Contractions: Not present. Vag. Bleeding: None.  Movement: Present. Denies leaking of fluid.   The following portions of the patient's history were reviewed and updated as appropriate: allergies, current medications, past family history, past medical history, past social history, past surgical history and problem list. Problem list updated.  Objective:   Vitals:   06/29/17 1054  BP: 112/72  Pulse: 100  Temp: 97.6 F (36.4 C)  Weight: 202 lb (91.6 kg)    Fetal Status: Fetal Heart Rate (bpm): 145 Fundal Height: 33 cm Movement: Present     General:  Alert, oriented and cooperative. Patient is in no acute distress.  Skin: Skin is warm and dry. No rash noted.   Cardiovascular: Normal heart rate noted  Respiratory: Normal respiratory effort, no problems with respiration noted  Abdomen: Soft, gravid, appropriate for gestational age.  Pain/Pressure: Present     Pelvic: Cervical exam deferred        Extremities: Normal range of motion.  Edema: Trace  Mental Status:  Normal mood and affect. Normal behavior. Normal judgment and thought content.   Assessment and Plan:  Pregnancy: G3P2002 at 4084w6d  1. Supervision of other normal pregnancy, antepartum   2. Upper respiratory infection, acute --Symptoms >1 week and recurrence x 2 this month with painful sinus pressure and  thick yellow/green mucus. --Likely started as a virus but evidence of bacterial/sinus infection so will treat with abx.   --Add saline nasal spray multiple times daily, increase PO fluids, rest as much as possible. - azithromycin (ZITHROMAX) 250 MG tablet; Take 2 tablets on Day 1, then 1 tablet daily on Day 2-5  Dispense: 6 tablet; Refill: 0  3. Acute recurrent frontal sinusitis --see above - azithromycin (ZITHROMAX) 250 MG tablet; Take 2 tablets on Day 1, then 1 tablet daily on Day 2-5  Dispense: 6 tablet; Refill: 0  4. Uterine size date discrepancy pregnancy, third trimester --Size greater than dates by 3 cm last week, only 2 cm today.  Largest baby >8 lbs with vaginal delivery. --Pt does feel like this baby is larger, has gained more weight. --Will watch at next visit, consider US if 3 weeks or greater or trend continues.   Preterm labor symptoms and general obstetric precautions including but not limited to vaginal bleeding, contractions, leaking of fluid and fetal movement were reviewed in detail with the patient. Please refer to After Visit Summary for other counseling recommendations.  Return in about 2 weeks (around 07/13/2017).   Sharen CounterLisa Leftwich-Kirby, CNM

## 2017-07-09 ENCOUNTER — Encounter: Payer: Self-pay | Admitting: Obstetrics and Gynecology

## 2017-07-09 ENCOUNTER — Ambulatory Visit (INDEPENDENT_AMBULATORY_CARE_PROVIDER_SITE_OTHER): Payer: 59 | Admitting: Obstetrics and Gynecology

## 2017-07-09 VITALS — BP 136/85 | HR 114 | Wt 206.0 lb

## 2017-07-09 DIAGNOSIS — O4703 False labor before 37 completed weeks of gestation, third trimester: Secondary | ICD-10-CM

## 2017-07-09 DIAGNOSIS — Z3480 Encounter for supervision of other normal pregnancy, unspecified trimester: Secondary | ICD-10-CM

## 2017-07-09 DIAGNOSIS — Z3483 Encounter for supervision of other normal pregnancy, third trimester: Secondary | ICD-10-CM

## 2017-07-09 MED ORDER — NIFEDIPINE ER OSMOTIC RELEASE 30 MG PO TB24: 30.0000 mg | ORAL_TABLET | Freq: Two times a day (BID) | ORAL | 2 refills | Status: DC

## 2017-07-09 NOTE — Progress Notes (Signed)
Pt has been having contractions every 3-5 minutes apart since 12:00pm today

## 2017-07-09 NOTE — Progress Notes (Signed)
   PRENATAL VISIT NOTE  Subjective:  Linda Wiggins is a 31 y.o. G3P2002 at 6969w2d being seen today for ongoing prenatal care.  She is currently monitored for the following issues for this low-risk pregnancy and has Supervision of other normal pregnancy, antepartum; Attention deficit disorder (ADD) without hyperactivity; HGSIL on cytologic smear of cervix; Tobacco use disorder; Mild binge-eating disorder, in full remission; and H/O LEEP on their problem list.  Patient reports contractions since noon.  Contractions: Regular. Vag. Bleeding: None.  Movement: Present. Denies leaking of fluid.   The following portions of the patient's history were reviewed and updated as appropriate: allergies, current medications, past family history, past medical history, past social history, past surgical history and problem list. Problem list updated.  Objective:   Vitals:   07/09/17 1550  BP: 136/85  Pulse: (!) 114  Weight: 206 lb (93.4 kg)    Fetal Status: Fetal Heart Rate (bpm): 152 Fundal Height: 33 cm Movement: Present     General:  Alert, oriented and cooperative. Patient is in no acute distress.  Skin: Skin is warm and dry. No rash noted.   Cardiovascular: Normal heart rate noted  Respiratory: Normal respiratory effort, no problems with respiration noted  Abdomen: Soft, gravid, appropriate for gestational age.  Pain/Pressure: Present     Pelvic: Cervical exam performed        Extremities: Normal range of motion.  Edema: Trace  Mental Status:  Normal mood and affect. Normal behavior. Normal judgment and thought content.   Assessment and Plan:  Pregnancy: G3P2002 at 6469w2d  1. Supervision of other normal pregnancy, antepartum   2. Preterm uterine contractions in third trimester, antepartum Reassurance provided regarding preterm contractions Rx procardia provided Advised patient to present to MAU if contractions persists despite procardia  Preterm labor symptoms and general obstetric  precautions including but not limited to vaginal bleeding, contractions, leaking of fluid and fetal movement were reviewed in detail with the patient. Please refer to After Visit Summary for other counseling recommendations.  Return in about 2 weeks (around 07/23/2017) for ROB.   Catalina AntiguaPeggy Kadon Andrus, MD

## 2017-07-10 ENCOUNTER — Encounter (HOSPITAL_COMMUNITY): Payer: Self-pay

## 2017-07-10 ENCOUNTER — Inpatient Hospital Stay (HOSPITAL_COMMUNITY)
Admission: AD | Admit: 2017-07-10 | Discharge: 2017-07-10 | Disposition: A | Discharge status: Home / Self Care | Payer: 59 | Source: Ambulatory Visit | Attending: Family Medicine | Admitting: Family Medicine

## 2017-07-10 ENCOUNTER — Other Ambulatory Visit: Payer: Self-pay

## 2017-07-10 DIAGNOSIS — O99333 Smoking (tobacco) complicating pregnancy, third trimester: Secondary | ICD-10-CM | POA: Diagnosis not present

## 2017-07-10 DIAGNOSIS — F17210 Nicotine dependence, cigarettes, uncomplicated: Secondary | ICD-10-CM | POA: Insufficient documentation

## 2017-07-10 DIAGNOSIS — Z3A32 32 weeks gestation of pregnancy: Secondary | ICD-10-CM

## 2017-07-10 DIAGNOSIS — O4703 False labor before 37 completed weeks of gestation, third trimester: Secondary | ICD-10-CM

## 2017-07-10 LAB — URINALYSIS, ROUTINE W REFLEX MICROSCOPIC
Bilirubin Urine: NEGATIVE
GLUCOSE, UA: NEGATIVE mg/dL
HGB URINE DIPSTICK: NEGATIVE
Ketones, ur: NEGATIVE mg/dL
NITRITE: NEGATIVE
PH: 7 (ref 5.0–8.0)
Protein, ur: NEGATIVE mg/dL
SPECIFIC GRAVITY, URINE: 1.003 — AB (ref 1.005–1.030)

## 2017-07-10 MED ORDER — NIFEDIPINE 10 MG PO CAPS
10.0000 mg | ORAL_CAPSULE | ORAL | Status: AC | PRN
  Administered 2017-07-10 (×3): 10 mg via ORAL
  Filled 2017-07-10 (×3): qty 1

## 2017-07-10 MED ORDER — ACETAMINOPHEN 500 MG PO TABS
1000.0000 mg | ORAL_TABLET | Freq: Once | ORAL | Status: AC
  Administered 2017-07-10: 1000 mg via ORAL
  Filled 2017-07-10: qty 2

## 2017-07-10 NOTE — Discharge Instructions (Signed)

## 2017-07-10 NOTE — MAU Provider Note (Signed)
History     CSN: [id number]  Arrival date and time: 07/10/17 1153   First Provider Initiated Contact with Patient 07/10/17 1245      Chief Complaint  Patient presents with  . Contractions   HPI Linda Wiggins is a 31 y.o. G3P2002 at 1271w3d who presents with contractions. Was seen in office yesterday afternoon for same complaint. Cervix was closed. Rx procardia 30 mg BID & given PTL precautions. States contractions have continued. Reports ctx every 3-5 minutes since yesterday. Rates pain 6/10. Last took procardia at 6 am this morning. Denies n/v/d, vaginal bleeding, or LOF. Positive fetal movement.   OB History    Gravida Para Term Preterm AB Living   3 2 2     2    SAB TAB Ectopic Multiple Live Births           2      Past Medical History:  Diagnosis Date  . Anemia   . Anxiety   . Chlamydia   . Migraines   . Vaginal Pap smear, abnormal     Past Surgical History:  Procedure Laterality Date  . ADENOIDECTOMY, TONSILLECTOMY AND MYRINGOTOMY WITH TUBE PLACEMENT    . HERNIA REPAIR    . LEEP    . TONSILLECTOMY      Family History  Problem Relation Age of Onset  . Cervical cancer Mother   . Heart murmur Father   . Skin cancer Maternal Grandmother   . Skin cancer Maternal Grandfather   . Skin cancer Paternal Grandmother   . Skin cancer Paternal Grandfather   . Heart disease Paternal Grandfather     Social History   Tobacco Use  . Smoking status: Current Every Day Smoker    Packs/day: 0.50    Types: Cigarettes  . Smokeless tobacco: Never Used  Substance Use Topics  . Alcohol use: No    Comment: Not while pregnant  . Drug use: No    Allergies:  Allergies  Allergen Reactions  . Tramadol     "I become paralyzed"    Medications Prior to Admission  Medication Sig Dispense Refill Last Dose  . azithromycin (ZITHROMAX) 250 MG tablet Take 2 tablets on Day 1, then 1 tablet daily on Day 2-5 (Patient not taking: Reported on 07/09/2017) 6 tablet 0 Not Taking  .  FLUoxetine (PROZAC) 20 MG capsule   5 Taking  . NIFEdipine (PROCARDIA-XL/ADALAT-CC/NIFEDICAL-XL) 30 MG 24 hr tablet Take 1 tablet (30 mg total) by mouth 2 (two) times daily. Can increase to twice a day as needed for symptomatic contractions 30 tablet 2   . ondansetron (ZOFRAN) 4 MG tablet Take 4 mg by mouth.   Taking  . Pediatric Multivit-Minerals-C (FLINTSTONES GUMMIES PLUS) CHEW Chew by mouth.   Taking    Review of Systems  Constitutional: Negative.   Gastrointestinal: Positive for abdominal pain. Negative for constipation, diarrhea, nausea and vomiting.  Genitourinary: Negative.    Physical Exam   Blood pressure 133/69, pulse (!) 104, temperature 98 F (36.7 C), temperature source Oral, resp. rate 20, height 5\' 3"  (1.6 m), weight 201 lb 8 oz (91.4 kg), last menstrual period 11/25/2016, SpO2 98 %.  Physical Exam  Nursing note and vitals reviewed. Constitutional: She is oriented to person, place, and time. She appears well-developed and well-nourished. No distress.  HENT:  Head: Normocephalic and atraumatic.  Eyes: Conjunctivae are normal. Right eye exhibits no discharge. Left eye exhibits no discharge. No scleral icterus.  Neck: Normal range of motion.  Respiratory: Effort  normal. No respiratory distress.  GI: Soft. There is no tenderness.  Ctx palpate mild w/adequate rest  Genitourinary:  Genitourinary Comments: Dilation: Closed Effacement (%): Thick Cervical Position: Posterior Station: -3 Exam by:: Judeth HornErin Whitleigh Garramone NP   Neurological: She is alert and oriented to person, place, and time.  Skin: Skin is warm and dry. She is not diaphoretic.  Psychiatric: She has a normal mood and affect. Her behavior is normal. Judgment and thought content normal.    MAU Course  Procedures Results for orders placed or performed during the hospital encounter of 07/10/17 (from the past 24 hour(s))  Urinalysis, Routine w reflex microscopic     Status: Abnormal   Collection Time: 07/10/17 11:56  AM  Result Value Ref Range   Color, Urine STRAW (A) YELLOW   APPearance HAZY (A) CLEAR   Specific Gravity, Urine 1.003 (L) 1.005 - 1.030   pH 7.0 5.0 - 8.0   Glucose, UA NEGATIVE NEGATIVE mg/dL   Hgb urine dipstick NEGATIVE NEGATIVE   Bilirubin Urine NEGATIVE NEGATIVE   Ketones, ur NEGATIVE NEGATIVE mg/dL   Protein, ur NEGATIVE NEGATIVE mg/dL   Nitrite NEGATIVE NEGATIVE   Leukocytes, UA TRACE (A) NEGATIVE   RBC / HPF 0-5 0 - 5 RBC/hpf   WBC, UA 0-5 0 - 5 WBC/hpf   Bacteria, UA MANY (A) NONE SEEN   Squamous Epithelial / LPF 6-30 (A) NONE SEEN   Mucus PRESENT     MDM NST:  Baseline: 130 bpm, Variability: Good {> 6 bpm), Accelerations: Reactive and Decelerations: Absent Unable to collected FFN d/t recent cervical exam in office Pt declines IV fluids. Will give procardia Ctx decreased with procardia in MAU & cervix unchanged  Assessment and Plan  A; 1. Preterm uterine contractions in third trimester, antepartum   2. [redacted] weeks gestation of pregnancy    P; Discharge home Continue procardia as prescribed Discussed reasons to return to MAU Increase water intake F/u with OB as scheduled  Judeth Hornrin Mireyah Chervenak 07/10/2017, 12:45 PM

## 2017-07-10 NOTE — MAU Note (Signed)
Pt present with c/o ctxs since yesterday.  Reports was seen yesterday @ MD office for ctxs, cervix closed, but soft.  Rx for Procardia 30 mg bid given.  States was told if ctxs continued to be seen in MAU  Denies VB or LOF.  Reports +FM.

## 2017-07-11 LAB — CULTURE, OB URINE: CULTURE: NO GROWTH

## 2017-07-13 ENCOUNTER — Encounter: Payer: 59 | Admitting: Advanced Practice Midwife

## 2017-07-23 ENCOUNTER — Ambulatory Visit (INDEPENDENT_AMBULATORY_CARE_PROVIDER_SITE_OTHER): Payer: 59 | Admitting: Obstetrics & Gynecology

## 2017-07-23 VITALS — BP 126/73 | HR 110 | Wt 210.0 lb

## 2017-07-23 DIAGNOSIS — Z3480 Encounter for supervision of other normal pregnancy, unspecified trimester: Secondary | ICD-10-CM

## 2017-07-23 DIAGNOSIS — Z3483 Encounter for supervision of other normal pregnancy, third trimester: Secondary | ICD-10-CM

## 2017-07-23 NOTE — Progress Notes (Signed)
   PRENATAL VISIT NOTE  Subjective:  Linda Wiggins is a 31 y.o. G3P2002 at 7665w2d being seen today for ongoing prenatal care.  She is currently monitored for the following issues for this low-risk pregnancy and has Supervision of other normal pregnancy, antepartum; Attention deficit disorder (ADD) without hyperactivity; HGSIL on cytologic smear of cervix; Tobacco use disorder; Mild binge-eating disorder, in full remission; and H/O LEEP on their problem list.  Patient reports fatigue and and headaches at night when she takes the procardia. .  Contractions: Irregular. Vag. Bleeding: None.  Movement: Present. Denies leaking of fluid.   The following portions of the patient's history were reviewed and updated as appropriate: allergies, current medications, past family history, past medical history, past social history, past surgical history and problem list. Problem list updated.  Objective:   Vitals:   07/23/17 1444  BP: 126/73  Pulse: (!) 110  Weight: 210 lb (95.3 kg)    Fetal Status:     Movement: Present     General:  Alert, oriented and cooperative. Patient is in no acute distress.  Skin: Skin is warm and dry. No rash noted.   Cardiovascular: Normal heart rate noted  Respiratory: Normal respiratory effort, no problems with respiration noted  Abdomen: Soft, gravid, appropriate for gestational age.  Pain/Pressure: Present     Pelvic: Cervical exam deferred        Extremities: Normal range of motion.  Edema: Trace  Mental Status:  Normal mood and affect. Normal behavior. Normal judgment and thought content.   Assessment and Plan:  Pregnancy: G3P2002 at 6465w2d  1. Supervision of other normal pregnancy, antepartum - I have told her that it is ok to stop the procardia if the headaches are bothersome.   Preterm labor symptoms and general obstetric precautions including but not limited to vaginal bleeding, contractions, leaking of fluid and fetal movement were reviewed in detail with the  patient. Please refer to After Visit Summary for other counseling recommendations.  No Follow-up on file.   Allie BossierMyra C Kerrington Greenhalgh, MD

## 2017-07-26 ENCOUNTER — Encounter (HOSPITAL_COMMUNITY): Payer: Self-pay | Admitting: *Deleted

## 2017-07-26 ENCOUNTER — Inpatient Hospital Stay (HOSPITAL_COMMUNITY)
Admission: AD | Admit: 2017-07-26 | Discharge: 2017-07-26 | Discharge status: Against Medical Advice | Payer: 59 | Source: Ambulatory Visit | Attending: Obstetrics & Gynecology | Admitting: Obstetrics & Gynecology

## 2017-07-26 DIAGNOSIS — O4703 False labor before 37 completed weeks of gestation, third trimester: Secondary | ICD-10-CM

## 2017-07-26 DIAGNOSIS — O99343 Other mental disorders complicating pregnancy, third trimester: Secondary | ICD-10-CM | POA: Diagnosis not present

## 2017-07-26 DIAGNOSIS — F419 Anxiety disorder, unspecified: Secondary | ICD-10-CM | POA: Insufficient documentation

## 2017-07-26 DIAGNOSIS — O99333 Smoking (tobacco) complicating pregnancy, third trimester: Secondary | ICD-10-CM | POA: Insufficient documentation

## 2017-07-26 DIAGNOSIS — G43909 Migraine, unspecified, not intractable, without status migrainosus: Secondary | ICD-10-CM | POA: Diagnosis not present

## 2017-07-26 DIAGNOSIS — O26893 Other specified pregnancy related conditions, third trimester: Secondary | ICD-10-CM | POA: Insufficient documentation

## 2017-07-26 DIAGNOSIS — Z3A34 34 weeks gestation of pregnancy: Secondary | ICD-10-CM | POA: Insufficient documentation

## 2017-07-26 LAB — URINALYSIS, ROUTINE W REFLEX MICROSCOPIC
BILIRUBIN URINE: NEGATIVE
GLUCOSE, UA: NEGATIVE mg/dL
HGB URINE DIPSTICK: NEGATIVE
Ketones, ur: NEGATIVE mg/dL
Leukocytes, UA: NEGATIVE
Nitrite: NEGATIVE
Protein, ur: NEGATIVE mg/dL
SPECIFIC GRAVITY, URINE: 1.003 — AB (ref 1.005–1.030)
pH: 6 (ref 5.0–8.0)

## 2017-07-26 MED ORDER — LACTATED RINGERS IV BOLUS (SEPSIS): 1000.0000 mL | Freq: Once | INTRAVENOUS | Status: DC

## 2017-07-26 MED ORDER — BETAMETHASONE SOD PHOS & ACET 6 (3-3) MG/ML IJ SUSP
12.0000 mg | INTRAMUSCULAR | Status: DC
  Administered 2017-07-26: 12 mg via INTRAMUSCULAR
  Filled 2017-07-26: qty 2

## 2017-07-26 NOTE — MAU Provider Note (Signed)
History     CSN: [id number]  Arrival date and time: 07/26/17 2022   First Provider Initiated Contact with Patient 07/26/17 2053      Chief Complaint  Patient presents with  . Labor Eval   HPI 31 yo G3P2002 at 8257w5d here for the evaluation of preterm contractions. Patient reports onset of contractions around 3pm being 5 minutes apart. She states that they are now 2-3 minutes apart. Patient reports good fetal movement and denies vaginal bleeding or leakage of fluid. Patient discontinued procardia due to fatigue and evening headaches earlier this week. Patient with [id number] otherwise uncomplicated  OB History    Gravida Para Term Preterm AB Living   3 2 2     2    SAB TAB Ectopic Multiple Live Births           2      Past Medical History:  Diagnosis Date  . Anemia   . Anxiety   . Chlamydia   . Migraines   . Vaginal Pap smear, abnormal     Past Surgical History:  Procedure Laterality Date  . ADENOIDECTOMY, TONSILLECTOMY AND MYRINGOTOMY WITH TUBE PLACEMENT    . HERNIA REPAIR    . LEEP    . TONSILLECTOMY      Family History  Problem Relation Age of Onset  . Cervical cancer Mother   . Heart murmur Father   . Skin cancer Maternal Grandmother   . Skin cancer Maternal Grandfather   . Skin cancer Paternal Grandmother   . Skin cancer Paternal Grandfather   . Heart disease Paternal Grandfather     Social History   Tobacco Use  . Smoking status: Current Every Day Smoker    Packs/day: 0.50    Types: Cigarettes  . Smokeless tobacco: Never Used  Substance Use Topics  . Alcohol use: No    Comment: Not while pregnant  . Drug use: No    Allergies:  Allergies  Allergen Reactions  . Tramadol     "I become paralyzed"    No medications prior to admission.    Review of Systems  See pertinent in HPI Physical Exam   Blood pressure 116/77, pulse (!) 116, temperature 98 F (36.7 C), temperature source Oral, resp. rate 18, last menstrual period  11/25/2016.  Physical Exam GENERAL: Well-developed, well-nourished female in no acute distress.  LUNGS: Clear to auscultation bilaterally.  HEART: Regular rate and rhythm. ABDOMEN: Soft, nontender, gravid Cervix: 1/thick/posterior EXTREMITIES: No cyanosis, clubbing, or edema, 2+ distal pulses.  FHT: baseline 120, mod variability, +accels, no decels Toco: ctx q 2-3 minutes  MAU Course  Procedures  MDM IV hydration- patient declined Procardia- patient declined because she feels she has it at home BMZ x 1 Assessment and Plan  31 yo G3P2 with preterm contractions - Patient upset. She reports having frequent contractions and desires to be induced. Discussed with patient that preterm induction without a medical indication will not be taking place tonight. Informed patient that our goal is for her to reach at least 37 weeks.  - Patient upset at the idea of having to take procardia as she states Dr. Marice Potterove told her to stop it completely. I informed her that she was instructed to stop the procardia on 12/20 due to the frequent headaches. Patient states that she never mentioned having headaches during her 12/20 visit and that she was just told to stop taking the procardia. - Discussed the benefits of taking betamethasone today and tomorrow given  her preterm contractions and concern for preterm labor. Patient agreed to taking her first dose today - Recommended admission for observation given her regular contraction and her visible discomfort. Patient refused. - I spent 15 minutes explaining the reason why augmentation of labor was not an option at this time. Patient was very disappointed. I also explained the reason behind admission or further observation in MAU to stop the contractions and make her more comfortable. Patient requested to go home - Patient to return to MAU if symptoms worsen - Follow up for routine prenatal care on 12/27  Ellery Meroney 07/26/2017, 9:47 PM

## 2017-07-26 NOTE — MAU Note (Signed)
Pt left before being able to sign out AMA

## 2017-07-26 NOTE — MAU Note (Signed)
Pt refused IV. Accepted the BMZ injection, but refused any further care. Pt encouraged to return for BMZ tomorrow.

## 2017-07-26 NOTE — MAU Note (Signed)
Pt reports uc's for 6 hours.

## 2017-07-26 NOTE — MAU Note (Signed)
Pt reports uc's q 2-3 minutes since 3pm. Denies lof or bleeding. + FM.

## 2017-07-30 ENCOUNTER — Other Ambulatory Visit (HOSPITAL_COMMUNITY)
Admission: RE | Admit: 2017-07-30 | Discharge: 2017-07-30 | Disposition: A | Discharge status: Home / Self Care | Payer: 59 | Source: Ambulatory Visit | Attending: Obstetrics & Gynecology | Admitting: Obstetrics & Gynecology

## 2017-07-30 ENCOUNTER — Ambulatory Visit (INDEPENDENT_AMBULATORY_CARE_PROVIDER_SITE_OTHER): Payer: 59 | Admitting: Obstetrics & Gynecology

## 2017-07-30 VITALS — BP 120/79 | HR 106 | Wt 209.0 lb

## 2017-07-30 DIAGNOSIS — F909 Attention-deficit hyperactivity disorder, unspecified type: Secondary | ICD-10-CM | POA: Insufficient documentation

## 2017-07-30 DIAGNOSIS — Z3480 Encounter for supervision of other normal pregnancy, unspecified trimester: Secondary | ICD-10-CM | POA: Insufficient documentation

## 2017-07-30 DIAGNOSIS — O99343 Other mental disorders complicating pregnancy, third trimester: Secondary | ICD-10-CM | POA: Diagnosis not present

## 2017-07-30 DIAGNOSIS — O99333 Smoking (tobacco) complicating pregnancy, third trimester: Secondary | ICD-10-CM | POA: Insufficient documentation

## 2017-07-30 DIAGNOSIS — Z3483 Encounter for supervision of other normal pregnancy, third trimester: Secondary | ICD-10-CM

## 2017-07-30 DIAGNOSIS — Z3A35 35 weeks gestation of pregnancy: Secondary | ICD-10-CM | POA: Diagnosis not present

## 2017-07-30 LAB — OB RESULTS CONSOLE GBS: STREP GROUP B AG: NEGATIVE

## 2017-07-30 NOTE — Progress Notes (Signed)
   PRENATAL VISIT NOTE  Subjective:  Linda Wiggins is a 31 y.o. G3P2002 at 4454w2d being seen today for ongoing prenatal care.  She is currently monitored for the following issues for this low-risk pregnancy and has Supervision of other normal pregnancy, antepartum; Attention deficit disorder (ADD) without hyperactivity; HGSIL on cytologic smear of cervix; Tobacco use disorder; Mild binge-eating disorder, in full remission; and H/O LEEP on their problem list.  Patient reports no complaints.  Contractions: Irritability. Vag. Bleeding: None.  Movement: Present. Denies leaking of fluid.   The following portions of the patient's history were reviewed and updated as appropriate: allergies, current medications, past family history, past medical history, past social history, past surgical history and problem list. Problem list updated.  Objective:   Vitals:   07/30/17 1545  BP: 120/79  Pulse: (!) 106  Weight: 209 lb (94.8 kg)    Fetal Status:     Movement: Present     General:  Alert, oriented and cooperative. Patient is in no acute distress.  Skin: Skin is warm and dry. No rash noted.   Cardiovascular: Normal heart rate noted  Respiratory: Normal respiratory effort, no problems with respiration noted  Abdomen: Soft, gravid, appropriate for gestational age.  Pain/Pressure: Present     Pelvic: Cervical exam performed        Extremities: Normal range of motion.  Edema: Trace  Mental Status:  Normal mood and affect. Normal behavior. Normal judgment and thought content.   Assessment and Plan:  Pregnancy: G3P2002 at 4054w2d  1. Supervision of other normal pregnancy, antepartum  - Cervicovaginal ancillary only - Culture, beta strep (group b only)  Preterm labor symptoms and general obstetric precautions including but not limited to vaginal bleeding, contractions, leaking of fluid and fetal movement were reviewed in detail with the patient. Please refer to After Visit Summary for other  counseling recommendations.  No Follow-up on file.   Allie BossierMyra C Sheena Donegan, MD

## 2017-07-30 NOTE — Progress Notes (Signed)
lab

## 2017-07-31 ENCOUNTER — Encounter (HOSPITAL_COMMUNITY): Payer: Self-pay

## 2017-08-02 LAB — CULTURE, BETA STREP (GROUP B ONLY)
MICRO NUMBER: 81452696
SPECIMEN QUALITY: ADEQUATE

## 2017-08-03 LAB — CERVICOVAGINAL ANCILLARY ONLY
Chlamydia: NEGATIVE
NEISSERIA GONORRHEA: NEGATIVE

## 2017-08-04 NOTE — L&D Delivery Note (Signed)
Patient is 32 y.o. [id number] 6774w1d admitted SOL/SROM. Pitocin given at time of delivery to increase contractions. Prenatal course also complicated by tobacco use disorder, Mild binge-eating disorder, ADD w/out hyperactivity, h/o HGSIL,LEEP.  Delivery Note At 4:32 AM a viable female was delivered via Vaginal, Spontaneous (Presentation: ROA ).  APGAR: 9, 9; weight  pending.   Placenta status: spontaneous, intact, .  Cord:  with the following complications:  3 vessel   Anesthesia:  Epidural  Episiotomy: None Lacerations: None, small skin tear  Est. Blood Loss (mL):  50cc   Head delivered ROA. No nuchal cord present. Shoulder and body delivered in usual fashion. Infant with spontaneous cry, placed on mother's abdomen, dried and bulb suctioned. Cord clamped x 2 after 1.5-minute delay, and cut by RN per family's request. Cord blood drawn. Placenta delivered spontaneously with gentle cord traction. Fundus firm with massage and Pitocin. Perineum inspected and found to have no laceration, small skin tear found to be hemostatic and did not require repair.   Mom to postpartum with plan for BTL.  Baby to Couplet care / Skin to Skin.  Oralia ManisSherin Shelda Truby, DO PGY-1 08/26/2017, 4:46 AM

## 2017-08-06 ENCOUNTER — Ambulatory Visit (INDEPENDENT_AMBULATORY_CARE_PROVIDER_SITE_OTHER): Payer: 59 | Admitting: Obstetrics & Gynecology

## 2017-08-06 VITALS — BP 114/78 | HR 96 | Wt 213.0 lb

## 2017-08-06 DIAGNOSIS — F988 Other specified behavioral and emotional disorders with onset usually occurring in childhood and adolescence: Secondary | ICD-10-CM

## 2017-08-06 DIAGNOSIS — Z3480 Encounter for supervision of other normal pregnancy, unspecified trimester: Secondary | ICD-10-CM

## 2017-08-06 NOTE — Progress Notes (Signed)
   PRENATAL VISIT NOTE  Subjective:  Cherlynn Kaiseratasha Logue is a 32 y.o. G3P2002 at 2841w2d being seen today for ongoing prenatal care.  She is currently monitored for the following issues for this low-risk pregnancy and has Supervision of other normal pregnancy, antepartum; Attention deficit disorder (ADD) without hyperactivity; HGSIL on cytologic smear of cervix; Tobacco use disorder; Mild binge-eating disorder, in full remission; and H/O LEEP on their problem list.  Patient reports no complaints.  Contractions: Irritability. Vag. Bleeding: None.  Movement: Present. Denies leaking of fluid.   The following portions of the patient's history were reviewed and updated as appropriate: allergies, current medications, past family history, past medical history, past social history, past surgical history and problem list. Problem list updated.  Objective:   Vitals:   08/06/17 1544  BP: 114/78  Pulse: 96  Weight: 213 lb (96.6 kg)    Fetal Status: Fetal Heart Rate (bpm): 137   Movement: Present     General:  Alert, oriented and cooperative. Patient is in no acute distress.  Skin: Skin is warm and dry. No rash noted.   Cardiovascular: Normal heart rate noted  Respiratory: Normal respiratory effort, no problems with respiration noted  Abdomen: Soft, gravid, appropriate for gestational age.  Pain/Pressure: Present     Pelvic: Cervical exam performed      tight 2/50/-3  Extremities: Normal range of motion.  Edema: Trace  Mental Status:  Normal mood and affect. Normal behavior. Normal judgment and thought content.   Assessment and Plan:  Pregnancy: G3P2002 at 2241w2d  1.  GBS negative; nml pregnancy. 2.  Pt has some uppper arm numbness--bilateral at times.  Strength is 5/5 in UE, nml DTR, sensation intact.  Offered to send to UC but patient declines.  Pt will notify us if worse or becomes weak.    Preterm labor symptoms and general obstetric precautions including but not limited to vaginal bleeding,  contractions, leaking of fluid and fetal movement were reviewed in detail with the patient. Please refer to After Visit Summary for other counseling recommendations.  Return in about 1 week (around 08/13/2017).   Elsie LincolnKelly Kelven Flater, MD

## 2017-08-14 ENCOUNTER — Ambulatory Visit (INDEPENDENT_AMBULATORY_CARE_PROVIDER_SITE_OTHER): Payer: 59 | Admitting: Certified Nurse Midwife

## 2017-08-14 VITALS — BP 132/84 | HR 106 | Wt 215.0 lb

## 2017-08-14 DIAGNOSIS — Z3480 Encounter for supervision of other normal pregnancy, unspecified trimester: Secondary | ICD-10-CM

## 2017-08-14 DIAGNOSIS — O368130 Decreased fetal movements, third trimester, not applicable or unspecified: Secondary | ICD-10-CM

## 2017-08-14 NOTE — Progress Notes (Signed)
Pt had bleeding Saturday, Sunday & Monday. Then this morning she had blood when she wiped.

## 2017-08-14 NOTE — Patient Instructions (Signed)
Braxton Hicks Contractions °Contractions of the uterus can occur throughout pregnancy, but they are not always a sign that you are in labor. You may have practice contractions called Braxton Hicks contractions. These false labor contractions are sometimes confused with true labor. °What are Braxton Hicks contractions? °Braxton Hicks contractions are tightening movements that occur in the muscles of the uterus before labor. Unlike true labor contractions, these contractions do not result in opening (dilation) and thinning of the cervix. Toward the end of pregnancy (32-34 weeks), Braxton Hicks contractions can happen more often and may become stronger. These contractions are sometimes difficult to tell apart from true labor because they can be very uncomfortable. You should not feel embarrassed if you go to the hospital with false labor. °Sometimes, the only way to tell if you are in true labor is for your health care provider to look for changes in the cervix. The health care provider will do a physical exam and may monitor your contractions. If you are not in true labor, the exam should show that your cervix is not dilating and your water has not broken. °If there are other health problems associated with your pregnancy, it is completely safe for you to be sent home with false labor. You may continue to have Braxton Hicks contractions until you go into true labor. °How to tell the difference between true labor and false labor °True labor °· Contractions last 30-70 seconds. °· Contractions become very regular. °· Discomfort is usually felt in the top of the uterus, and it spreads to the lower abdomen and low back. °· Contractions do not go away with walking. °· Contractions usually become more intense and increase in frequency. °· The cervix dilates and gets thinner. °False labor °· Contractions are usually shorter and not as strong as true labor contractions. °· Contractions are usually irregular. °· Contractions  are often felt in the front of the lower abdomen and in the groin. °· Contractions may go away when you walk around or change positions while lying down. °· Contractions get weaker and are shorter-lasting as time goes on. °· The cervix usually does not dilate or become thin. °Follow these instructions at home: °· Take over-the-counter and prescription medicines only as told by your health care provider. °· Keep up with your usual exercises and follow other instructions from your health care provider. °· Eat and drink lightly if you think you are going into labor. °· If Braxton Hicks contractions are making you uncomfortable: °? Change your position from lying down or resting to walking, or change from walking to resting. °? Sit and rest in a tub of warm water. °? Drink enough fluid to keep your urine pale yellow. Dehydration may cause these contractions. °? Do slow and deep breathing several times an hour. °· Keep all follow-up prenatal visits as told by your health care provider. This is important. °Contact a health care provider if: °· You have a fever. °· You have continuous pain in your abdomen. °Get help right away if: °· Your contractions become stronger, more regular, and closer together. °· You have fluid leaking or gushing from your vagina. °· You pass blood-tinged mucus (bloody show). °· You have bleeding from your vagina. °· You have low back pain that you never had before. °· You feel your baby’s head pushing down and causing pelvic pressure. °· Your baby is not moving inside you as much as it used to. °Summary °· Contractions that occur before labor are called Braxton   Hicks contractions, false labor, or practice contractions. °· Braxton Hicks contractions are usually shorter, weaker, farther apart, and less regular than true labor contractions. True labor contractions usually become progressively stronger and regular and they become more frequent. °· Manage discomfort from Braxton Hicks contractions by  changing position, resting in a warm bath, drinking plenty of water, or practicing deep breathing. °This information is not intended to replace advice given to you by your health care provider. Make sure you discuss any questions you have with your health care provider. °Document Released: 12/04/2016 Document Revised: 12/04/2016 Document Reviewed: 12/04/2016 °Elsevier Interactive Patient Education © 2018 Elsevier Inc. ° °

## 2017-08-14 NOTE — Progress Notes (Signed)
Subjective:  Linda Wiggins is a 32 y.o. G3P2002 at 5268w3d being seen today for ongoing prenatal care.  She is currently monitored for the following issues for this low-risk pregnancy and has Supervision of other normal pregnancy, antepartum; Attention deficit disorder (ADD) without hyperactivity; HGSIL on cytologic smear of cervix; Tobacco use disorder; Mild binge-eating disorder, in full remission; and H/O LEEP on their problem list.  Patient reports No FM this am. Had biscuit and water to eat..  Contractions: Irritability. Vag. Bleeding: Scant.  Movement: Present. Denies leaking of fluid.   The following portions of the patient's history were reviewed and updated as appropriate: allergies, current medications, past family history, past medical history, past social history, past surgical history and problem list. Problem list updated.  Objective:   Vitals:   08/14/17 0932  BP: 132/84  Pulse: (!) 106  Weight: 97.5 kg (215 lb)    Fetal Status: Fetal Heart Rate (bpm): 135 Fundal Height: 39 cm Movement: Present  Presentation: Vertex  General:  Alert, oriented and cooperative. Patient is in no acute distress.  Skin: Skin is warm and dry. No rash noted.   Cardiovascular: Normal heart rate noted  Respiratory: Normal respiratory effort, no problems with respiration noted  Abdomen: Soft, gravid, appropriate for gestational age. Pain/Pressure: Present     Pelvic: Vag. Bleeding: Scant Vag D/C Character: Thick   Cervical exam performed Dilation: 2 Effacement (%): 70 Station: -3  Extremities: Normal range of motion.  Edema: Trace  Mental Status: Normal mood and affect. Normal behavior. Normal judgment and thought content.   Urinalysis:      Assessment and Plan:  Pregnancy: G3P2002 at 4568w3d  1. Decreased fetal movements in third trimester, single or unspecified fetus - Fetal nonstress test - NST reactive  2. Supervision of other normal pregnancy, antepartum - spotting for 3 days after last  SVE, ctx last night and pink spotting this am - likely cervical effacement  Term labor symptoms and general obstetric precautions including but not limited to vaginal bleeding, contractions, leaking of fluid and fetal movement were reviewed in detail with the patient. Please refer to After Visit Summary for other counseling recommendations.  Return in about 1 week (around 08/21/2017).   Linda Wiggins, Linda Wiggins, CNM

## 2017-08-20 ENCOUNTER — Encounter: Payer: 59 | Admitting: Obstetrics and Gynecology

## 2017-08-21 ENCOUNTER — Ambulatory Visit (INDEPENDENT_AMBULATORY_CARE_PROVIDER_SITE_OTHER): Payer: 59 | Admitting: Advanced Practice Midwife

## 2017-08-21 VITALS — BP 131/82 | HR 102 | Wt 220.0 lb

## 2017-08-21 DIAGNOSIS — M549 Dorsalgia, unspecified: Secondary | ICD-10-CM

## 2017-08-21 DIAGNOSIS — Z3483 Encounter for supervision of other normal pregnancy, third trimester: Secondary | ICD-10-CM

## 2017-08-21 DIAGNOSIS — O9989 Other specified diseases and conditions complicating pregnancy, childbirth and the puerperium: Secondary | ICD-10-CM

## 2017-08-21 DIAGNOSIS — Z3480 Encounter for supervision of other normal pregnancy, unspecified trimester: Secondary | ICD-10-CM

## 2017-08-21 DIAGNOSIS — O99891 Other specified diseases and conditions complicating pregnancy: Secondary | ICD-10-CM

## 2017-08-21 NOTE — Patient Instructions (Addendum)
Try the [employer] for better baby positioning for labor  Labor Precautions Reasons to come to MAU:  1.  Contractions are  5 minutes apart or less, each last 1 minute, these have been going on for 1-2 hours, and you cannot walk or talk during them 2.  You have a large gush of fluid, or a trickle of fluid that will not stop and you have to wear a pad 3.  You have bleeding that is bright red, heavier than spotting--like menstrual bleeding (spotting can be normal in early labor or after a check of your cervix) 4.  You do not feel the baby moving like he/she normally does   Back Pain in Pregnancy Back pain during pregnancy is common. Back pain may be caused by several factors that are related to changes during your pregnancy. Follow these instructions at home: Managing pain, stiffness, and swelling  If directed, apply ice for sudden (acute) back pain. ? Put ice in a plastic bag. ? Place a towel between your skin and the bag. ? Leave the ice on for 20 minutes, 2-3 times per day.  If directed, apply heat to the affected area before you exercise: ? Place a towel between your skin and the heat pack or heating pad. ? Leave the heat on for 20-30 minutes. ? Remove the heat if your skin turns bright red. This is especially important if you are unable to feel pain, heat, or cold. You may have a greater risk of getting burned. Activity  Exercise as told by your health care provider. Exercising is the best way to prevent or manage back pain.  Listen to your body when lifting. If lifting hurts, ask for help or bend your knees. This uses your leg muscles instead of your back muscles.  Squat down when picking up something from the floor. Do not bend over.  Only use bed rest as told by your health care provider. Bed rest should only be used for the most severe episodes of back pain. Standing, Sitting, and Lying Down  Do not stand in one place for long periods of time.  Use good posture when  sitting. Make sure your head rests over your shoulders and is not hanging forward. Use a pillow on your lower back if necessary.  Try sleeping on your side, preferably the left side, with a pillow or two between your legs. If you are sore after a night's rest, your bed may be too soft. A firm mattress may provide more support for your back during pregnancy. General instructions  Do not wear high heels.  Eat a healthy diet. Try to gain weight within your health care provider's recommendations.  Use a maternity girdle, elastic sling, or back brace as told by your health care provider.  Take over-the-counter and prescription medicines only as told by your health care provider.  Keep all follow-up visits as told by your health care provider. This is important. This includes any visits with any specialists, such as a physical therapist. Contact a health care provider if:  Your back pain interferes with your daily activities.  You have increasing pain in other parts of your body. Get help right away if:  You develop numbness, tingling, weakness, or problems with the use of your arms or legs.  You develop severe back pain that is not controlled with medicine.  You have a sudden change in bowel or bladder control.  You develop shortness of breath, dizziness, or you faint.  You  develop nausea, vomiting, or sweating.  You have back pain that is a rhythmic, cramping pain similar to labor pains. Labor pain is usually 1-2 minutes apart, lasts for about 1 minute, and involves a bearing down feeling or pressure in your pelvis.  You have back pain and your water breaks or you have vaginal bleeding.  You have back pain or numbness that travels down your leg.  Your back pain developed after you fell.  You develop pain on one side of your back.  You see blood in your urine.  You develop skin blisters in the area of your back pain. This information is not intended to replace advice given to  you by your health care provider. Make sure you discuss any questions you have with your health care provider. Document Released: 10/29/2005 Document Revised: 12/27/2015 Document Reviewed: 04/04/2015 Elsevier Interactive Patient Education  [employer]2018 [employer] Inc[employer]

## 2017-08-21 NOTE — Progress Notes (Signed)
   PRENATAL VISIT NOTE  Subjective:  Linda Wiggins is a 32 y.o. G3P2002 at 7021w3d being seen today for ongoing prenatal care.  She is currently monitored for the following issues for this low-risk pregnancy and has Supervision of other normal pregnancy, antepartum; Attention deficit disorder (ADD) without hyperactivity; HGSIL on cytologic smear of cervix; Tobacco use disorder; Mild binge-eating disorder, in full remission; and H/O LEEP on their problem list.  Patient reports backache and contractions since 2 weeks ago.  Contractions: Regular. Vag. Bleeding: None.  Movement: Present. Denies leaking of fluid.   The following portions of the patient's history were reviewed and updated as appropriate: allergies, current medications, past family history, past medical history, past social history, past surgical history and problem list. Problem list updated.  Objective:   Vitals:   08/21/17 0956  BP: 131/82  Pulse: (!) 102  Weight: 220 lb (99.8 kg)    Fetal Status: Fetal Heart Rate (bpm): 145 Fundal Height: 39 cm Movement: Present  Presentation: Vertex  General:  Alert, oriented and cooperative. Patient is in no acute distress.  Skin: Skin is warm and dry. No rash noted.   Cardiovascular: Normal heart rate noted  Respiratory: Normal respiratory effort, no problems with respiration noted  Abdomen: Soft, gravid, appropriate for gestational age.  Pain/Pressure: Present     Pelvic: Cervical exam performed Dilation: 3 Effacement (%): 70 Station: -2  Extremities: Normal range of motion.  Edema: Trace  Mental Status:  Normal mood and affect. Normal behavior. Normal judgment and thought content.   Assessment and Plan:  Pregnancy: G3P2002 at 2521w3d  1. Supervision of other normal pregnancy, antepartum --Pt has family hx of stillbirth, not personal hx but does not want to pass her due date due to risk. --Membranes swept on cervical exam today at pt request, head well applied to cervix today, and pt  tolerated well. Scant light red bleeding noted on glove, discussed bleeding expectations after exam with pt vs reasons to come to MAU.  2. Back pain affecting pregnancy in third trimester --Discussed maternal positioning to improve fetal positioning and comfort measures for back pain. --Pt with preexisting disk problems so may improve pain but not relieve it until after pregnancy.  Term labor symptoms and general obstetric precautions including but not limited to vaginal bleeding, contractions, leaking of fluid and fetal movement were reviewed in detail with the patient. Please refer to After Visit Summary for other counseling recommendations.  Return in about 1 week (around 08/28/2017).   Sharen CounterLisa Leftwich-Kirby, CNM

## 2017-08-24 ENCOUNTER — Inpatient Hospital Stay (HOSPITAL_COMMUNITY)
Admission: AD | Admit: 2017-08-24 | Discharge: 2017-08-24 | Disposition: A | Discharge status: Home / Self Care | Payer: 59 | Source: Ambulatory Visit | Attending: Obstetrics and Gynecology | Admitting: Obstetrics and Gynecology

## 2017-08-24 ENCOUNTER — Encounter (HOSPITAL_COMMUNITY): Payer: Self-pay | Admitting: *Deleted

## 2017-08-24 DIAGNOSIS — O479 False labor, unspecified: Secondary | ICD-10-CM

## 2017-08-24 LAB — AMNISURE RUPTURE OF MEMBRANE (ROM) NOT AT ARMC: Amnisure ROM: NEGATIVE

## 2017-08-24 NOTE — MAU Note (Signed)
Pt reports leaking fluid since 2 am, bright red bleeding on tissue when she wipes. Contractions

## 2017-08-24 NOTE — MAU Provider Note (Signed)
S: Ms. Linda Wiggins is a 32 y.o. G3P2002 at 2365w6d  who presents to MAU today complaining of leaking of fluid since 0200. She endorses vaginal bleeding. She endorses contractions. She reports normal fetal movement.    O: BP 136/82 (BP Location: Right Arm)   Pulse (!) 116   Temp (!) 97.3 F (36.3 C) (Oral)   Resp 16   Ht 5\' 3"  (1.6 m)   Wt 218 lb (98.9 kg)   LMP 11/25/2016   SpO2 100%   BMI 38.62 kg/m  GENERAL: Well-developed, well-nourished female in no acute distress.  HEAD: Normocephalic, atraumatic.  CHEST: Normal effort of breathing, regular heart rate ABDOMEN: Soft, nontender, gravid PELVIC: Normal external female genitalia. Vagina is pink and rugated. Cervix with normal contour, no lesions. Normal discharge.  No pooling.   Fetal Monitoring: Baseline: 130 Variability: moderate Accelerations: 15x15 Decelerations: none  Contractions: none  Results for orders placed or performed during the hospital encounter of 08/24/17 (from the past 24 hour(s))  Amnisure rupture of membrane (rom)not at [facility]     Status: None   Collection Time: 08/24/17  9:24 AM  Result Value Ref Range   Amnisure ROM NEGATIVE    Fern Negative  A: SIUP at 6165w6d  Membranes intact  P: RN to call labor team with results  Rolm Bookbindereill, Caroline M, CNM 08/24/2017 10:06 AM

## 2017-08-24 NOTE — Discharge Instructions (Signed)
Braxton Hicks Contractions °Contractions of the uterus can occur throughout pregnancy, but they are not always a sign that you are in labor. You may have practice contractions called Braxton Hicks contractions. These false labor contractions are sometimes confused with true labor. °What are Braxton Hicks contractions? °Braxton Hicks contractions are tightening movements that occur in the muscles of the uterus before labor. Unlike true labor contractions, these contractions do not result in opening (dilation) and thinning of the cervix. Toward the end of pregnancy (32-34 weeks), Braxton Hicks contractions can happen more often and may become stronger. These contractions are sometimes difficult to tell apart from true labor because they can be very uncomfortable. You should not feel embarrassed if you go to the hospital with false labor. °Sometimes, the only way to tell if you are in true labor is for your health care provider to look for changes in the cervix. The health care provider will do a physical exam and may monitor your contractions. If you are not in true labor, the exam should show that your cervix is not dilating and your water has not broken. °If there are other health problems associated with your pregnancy, it is completely safe for you to be sent home with false labor. You may continue to have Braxton Hicks contractions until you go into true labor. °How to tell the difference between true labor and false labor °True labor °· Contractions last 30-70 seconds. °· Contractions become very regular. °· Discomfort is usually felt in the top of the uterus, and it spreads to the lower abdomen and low back. °· Contractions do not go away with walking. °· Contractions usually become more intense and increase in frequency. °· The cervix dilates and gets thinner. °False labor °· Contractions are usually shorter and not as strong as true labor contractions. °· Contractions are usually irregular. °· Contractions  are often felt in the front of the lower abdomen and in the groin. °· Contractions may go away when you walk around or change positions while lying down. °· Contractions get weaker and are shorter-lasting as time goes on. °· The cervix usually does not dilate or become thin. °Follow these instructions at home: °· Take over-the-counter and prescription medicines only as told by your health care provider. °· Keep up with your usual exercises and follow other instructions from your health care provider. °· Eat and drink lightly if you think you are going into labor. °· If Braxton Hicks contractions are making you uncomfortable: °? Change your position from lying down or resting to walking, or change from walking to resting. °? Sit and rest in a tub of warm water. °? Drink enough fluid to keep your urine pale yellow. Dehydration may cause these contractions. °? Do slow and deep breathing several times an hour. °· Keep all follow-up prenatal visits as told by your health care provider. This is important. °Contact a health care provider if: °· You have a fever. °· You have continuous pain in your abdomen. °Get help right away if: °· Your contractions become stronger, more regular, and closer together. °· You have fluid leaking or gushing from your vagina. °· You have bleeding from your vagina. °· You have low back pain that you never had before. °· You feel your baby’s head pushing down and causing pelvic pressure. °· Your baby is not moving inside you as much as it used to. °Summary °· Contractions that occur before labor are called Braxton Hicks contractions, false labor, or practice contractions. °·   Braxton Hicks contractions are usually shorter, weaker, farther apart, and less regular than true labor contractions. True labor contractions usually become progressively stronger and regular and they become more frequent. °· Manage discomfort from Braxton Hicks contractions by changing position, resting in a warm bath,  drinking plenty of water, or practicing deep breathing. °This information is not intended to replace advice given to you by your health care provider. Make sure you discuss any questions you have with your health care provider. °Document Released: 12/04/2016 Document Revised: 12/04/2016 Document Reviewed: 12/04/2016 °Elsevier Interactive Patient Education © 2018 [employer] ° °

## 2017-08-24 NOTE — MAU Note (Signed)
I have communicated with Dr. Rushie GoltzLeland and reviewed vital signs:  Vitals:   08/24/17 0846 08/24/17 1022  BP: 136/82 130/82  Pulse: (!) 116 (!) 109  Resp: 16 18  Temp: (!) 97.3 F (36.3 C)   SpO2: 100%     Vaginal exam:  Dilation: 3 Effacement (%): 80 Cervical Position: Posterior Station: -2 Presentation: Vertex Exam by:: Dorrene GermanJ. Shamari Lofquist RN,   Also reviewed contraction pattern and that non-stress test is reactive.  It has been documented that patient is contracting irregularly, fern & amnisure are negative, not indicating active labor.  Patient denies any other complaints.  Based on this report provider has given order for discharge.  A discharge order and diagnosis entered by a provider.   Labor discharge instructions reviewed with patient.

## 2017-08-25 ENCOUNTER — Encounter (HOSPITAL_COMMUNITY): Payer: Self-pay

## 2017-08-25 ENCOUNTER — Inpatient Hospital Stay (HOSPITAL_COMMUNITY)
Admission: AD | Admit: 2017-08-25 | Discharge: 2017-08-27 | DRG: 807 | Disposition: A | Discharge status: Home / Self Care | Payer: 59 | Source: Ambulatory Visit | Attending: Family Medicine | Admitting: Family Medicine

## 2017-08-25 DIAGNOSIS — D649 Anemia, unspecified: Secondary | ICD-10-CM | POA: Diagnosis present

## 2017-08-25 DIAGNOSIS — F988 Other specified behavioral and emotional disorders with onset usually occurring in childhood and adolescence: Secondary | ICD-10-CM | POA: Diagnosis present

## 2017-08-25 DIAGNOSIS — F17210 Nicotine dependence, cigarettes, uncomplicated: Secondary | ICD-10-CM | POA: Diagnosis present

## 2017-08-25 DIAGNOSIS — F5081 Binge eating disorder: Secondary | ICD-10-CM | POA: Diagnosis present

## 2017-08-25 DIAGNOSIS — Z3A39 39 weeks gestation of pregnancy: Secondary | ICD-10-CM

## 2017-08-25 DIAGNOSIS — O9902 Anemia complicating childbirth: Secondary | ICD-10-CM | POA: Diagnosis present

## 2017-08-25 DIAGNOSIS — O99334 Smoking (tobacco) complicating childbirth: Secondary | ICD-10-CM | POA: Diagnosis present

## 2017-08-25 DIAGNOSIS — O99344 Other mental disorders complicating childbirth: Secondary | ICD-10-CM | POA: Diagnosis present

## 2017-08-25 NOTE — MAU Note (Signed)
Pt here with c/o contractions, some bleeding today. Was checked yesterday and was 3 cm.

## 2017-08-26 ENCOUNTER — Encounter (HOSPITAL_COMMUNITY): Payer: Self-pay

## 2017-08-26 ENCOUNTER — Inpatient Hospital Stay (HOSPITAL_COMMUNITY): Payer: 59 | Admitting: Anesthesiology

## 2017-08-26 ENCOUNTER — Other Ambulatory Visit: Payer: Self-pay

## 2017-08-26 ENCOUNTER — Encounter (HOSPITAL_COMMUNITY)
Admission: AD | Disposition: A | Discharge status: Home / Self Care | Payer: Self-pay | Source: Ambulatory Visit | Attending: Family Medicine

## 2017-08-26 ENCOUNTER — Encounter: Payer: 59 | Admitting: Obstetrics & Gynecology

## 2017-08-26 DIAGNOSIS — Z3A39 39 weeks gestation of pregnancy: Secondary | ICD-10-CM

## 2017-08-26 DIAGNOSIS — O9902 Anemia complicating childbirth: Secondary | ICD-10-CM | POA: Diagnosis present

## 2017-08-26 DIAGNOSIS — F5081 Binge eating disorder: Secondary | ICD-10-CM | POA: Diagnosis present

## 2017-08-26 DIAGNOSIS — Z3483 Encounter for supervision of other normal pregnancy, third trimester: Secondary | ICD-10-CM | POA: Diagnosis present

## 2017-08-26 DIAGNOSIS — F17210 Nicotine dependence, cigarettes, uncomplicated: Secondary | ICD-10-CM | POA: Diagnosis present

## 2017-08-26 DIAGNOSIS — D649 Anemia, unspecified: Secondary | ICD-10-CM | POA: Diagnosis present

## 2017-08-26 DIAGNOSIS — F988 Other specified behavioral and emotional disorders with onset usually occurring in childhood and adolescence: Secondary | ICD-10-CM | POA: Diagnosis present

## 2017-08-26 DIAGNOSIS — O99344 Other mental disorders complicating childbirth: Secondary | ICD-10-CM | POA: Diagnosis present

## 2017-08-26 DIAGNOSIS — O99334 Smoking (tobacco) complicating childbirth: Secondary | ICD-10-CM | POA: Diagnosis present

## 2017-08-26 LAB — CBC
HCT: 28.4 % — ABNORMAL LOW (ref 36.0–46.0)
Hemoglobin: 9.3 g/dL — ABNORMAL LOW (ref 12.0–15.0)
MCH: 25.7 pg — ABNORMAL LOW (ref 26.0–34.0)
MCHC: 32.7 g/dL (ref 30.0–36.0)
MCV: 78.5 fL (ref 78.0–100.0)
PLATELETS: 242 10*3/uL (ref 150–400)
RBC: 3.62 MIL/uL — ABNORMAL LOW (ref 3.87–5.11)
RDW: 15.4 % (ref 11.5–15.5)
WBC: 17.2 10*3/uL — AB (ref 4.0–10.5)

## 2017-08-26 LAB — TYPE AND SCREEN
ABO/RH(D): B POS
Antibody Screen: NEGATIVE

## 2017-08-26 LAB — ABO/RH: ABO/RH(D): B POS

## 2017-08-26 LAB — RPR: RPR Ser Ql: NONREACTIVE

## 2017-08-26 SURGERY — LIGATION, FALLOPIAN TUBE, POSTPARTUM
Anesthesia: Epidural | Laterality: Bilateral

## 2017-08-26 MED ORDER — ACETAMINOPHEN 325 MG PO TABS: 650.0000 mg | ORAL_TABLET | ORAL | Status: DC | PRN

## 2017-08-26 MED ORDER — OXYTOCIN 40 UNITS IN LACTATED RINGERS INFUSION - SIMPLE MED
1.0000 m[IU]/min | INTRAVENOUS | Status: DC
  Administered 2017-08-26: 2 m[IU]/min via INTRAVENOUS

## 2017-08-26 MED ORDER — SENNOSIDES-DOCUSATE SODIUM 8.6-50 MG PO TABS
2.0000 | ORAL_TABLET | ORAL | Status: DC
  Administered 2017-08-27: 2 via ORAL
  Filled 2017-08-26: qty 2

## 2017-08-26 MED ORDER — PHENYLEPHRINE 40 MCG/ML (10ML) SYRINGE FOR IV PUSH (FOR BLOOD PRESSURE SUPPORT)
80.0000 ug | PREFILLED_SYRINGE | INTRAVENOUS | Status: DC | PRN
  Filled 2017-08-26: qty 5

## 2017-08-26 MED ORDER — COCONUT OIL OIL
1.0000 "application " | TOPICAL_OIL | Status: DC | PRN
  Administered 2017-08-27: 1 via TOPICAL
  Filled 2017-08-26: qty 120

## 2017-08-26 MED ORDER — WITCH HAZEL-GLYCERIN EX PADS: 1.0000 "application " | MEDICATED_PAD | CUTANEOUS | Status: DC | PRN

## 2017-08-26 MED ORDER — ACETAMINOPHEN 325 MG PO TABS
650.0000 mg | ORAL_TABLET | ORAL | Status: DC | PRN
  Administered 2017-08-26: 650 mg via ORAL
  Filled 2017-08-26: qty 2

## 2017-08-26 MED ORDER — DIPHENHYDRAMINE HCL 25 MG PO CAPS: 25.0000 mg | ORAL_CAPSULE | Freq: Four times a day (QID) | ORAL | Status: DC | PRN

## 2017-08-26 MED ORDER — BENZOCAINE-MENTHOL 20-0.5 % EX AERO: 1.0000 "application " | INHALATION_SPRAY | CUTANEOUS | Status: DC | PRN

## 2017-08-26 MED ORDER — DIPHENHYDRAMINE HCL 50 MG/ML IJ SOLN: 12.5000 mg | INTRAMUSCULAR | Status: DC | PRN

## 2017-08-26 MED ORDER — FENTANYL CITRATE (PF) 100 MCG/2ML IJ SOLN: 100.0000 ug | INTRAMUSCULAR | Status: DC | PRN

## 2017-08-26 MED ORDER — SIMETHICONE 80 MG PO CHEW: 80.0000 mg | CHEWABLE_TABLET | ORAL | Status: DC | PRN

## 2017-08-26 MED ORDER — FLUOXETINE HCL 20 MG PO CAPS
20.0000 mg | ORAL_CAPSULE | Freq: Every day | ORAL | Status: DC
  Administered 2017-08-26 – 2017-08-27 (×2): 20 mg via ORAL
  Filled 2017-08-26 (×3): qty 1

## 2017-08-26 MED ORDER — IBUPROFEN 600 MG PO TABS
600.0000 mg | ORAL_TABLET | Freq: Four times a day (QID) | ORAL | Status: DC
  Administered 2017-08-26 – 2017-08-27 (×5): 600 mg via ORAL
  Filled 2017-08-26 (×5): qty 1

## 2017-08-26 MED ORDER — LACTATED RINGERS IV SOLN: 500.0000 mL | INTRAVENOUS | Status: DC | PRN

## 2017-08-26 MED ORDER — ONDANSETRON HCL 4 MG/2ML IJ SOLN: 4.0000 mg | Freq: Four times a day (QID) | INTRAMUSCULAR | Status: DC | PRN

## 2017-08-26 MED ORDER — ONDANSETRON HCL 4 MG PO TABS
4.0000 mg | ORAL_TABLET | ORAL | Status: DC | PRN
Start: 2017-08-26 — End: 2017-08-27

## 2017-08-26 MED ORDER — PHENYLEPHRINE 40 MCG/ML (10ML) SYRINGE FOR IV PUSH (FOR BLOOD PRESSURE SUPPORT)
80.0000 ug | PREFILLED_SYRINGE | INTRAVENOUS | Status: DC | PRN
Start: 2017-08-26 — End: 2017-08-26
  Filled 2017-08-26: qty 5

## 2017-08-26 MED ORDER — TETANUS-DIPHTH-ACELL PERTUSSIS 5-2.5-18.5 LF-MCG/0.5 IM SUSP: 0.5000 mL | Freq: Once | INTRAMUSCULAR | Status: DC

## 2017-08-26 MED ORDER — FLUOXETINE HCL 20 MG PO CAPS
20.0000 mg | ORAL_CAPSULE | Freq: Every day | ORAL | Status: DC
  Filled 2017-08-26: qty 1

## 2017-08-26 MED ORDER — OXYTOCIN BOLUS FROM INFUSION
500.0000 mL | Freq: Once | INTRAVENOUS | Status: AC
  Administered 2017-08-26: 500 mL via INTRAVENOUS

## 2017-08-26 MED ORDER — FENTANYL 2.5 MCG/ML BUPIVACAINE 1/10 % EPIDURAL INFUSION (WH - ANES)
14.0000 mL/h | INTRAMUSCULAR | Status: DC | PRN
  Administered 2017-08-26: 14 mL/h via EPIDURAL

## 2017-08-26 MED ORDER — TERBUTALINE SULFATE 1 MG/ML IJ SOLN
0.2500 mg | Freq: Once | INTRAMUSCULAR | Status: DC | PRN
  Filled 2017-08-26: qty 1

## 2017-08-26 MED ORDER — SOD CITRATE-CITRIC ACID 500-334 MG/5ML PO SOLN: 30.0000 mL | ORAL | Status: DC | PRN

## 2017-08-26 MED ORDER — ZOLPIDEM TARTRATE 5 MG PO TABS: 5.0000 mg | ORAL_TABLET | Freq: Every evening | ORAL | Status: DC | PRN

## 2017-08-26 MED ORDER — LACTATED RINGERS IV SOLN
500.0000 mL | Freq: Once | INTRAVENOUS | Status: AC
  Administered 2017-08-26: 500 mL via INTRAVENOUS

## 2017-08-26 MED ORDER — FAMOTIDINE 20 MG PO TABS
40.0000 mg | ORAL_TABLET | Freq: Once | ORAL | Status: AC
  Administered 2017-08-26: 40 mg via ORAL
  Filled 2017-08-26: qty 2

## 2017-08-26 MED ORDER — EPHEDRINE 5 MG/ML INJ
10.0000 mg | INTRAVENOUS | Status: DC | PRN
  Filled 2017-08-26: qty 2

## 2017-08-26 MED ORDER — FENTANYL 2.5 MCG/ML BUPIVACAINE 1/10 % EPIDURAL INFUSION (WH - ANES)
INTRAMUSCULAR | Status: AC
  Filled 2017-08-26: qty 100

## 2017-08-26 MED ORDER — LIDOCAINE HCL (PF) 1 % IJ SOLN
INTRAMUSCULAR | Status: DC | PRN
  Administered 2017-08-26: 4 mL via EPIDURAL

## 2017-08-26 MED ORDER — LACTATED RINGERS IV SOLN: INTRAVENOUS | Status: DC

## 2017-08-26 MED ORDER — LACTATED RINGERS IV SOLN
INTRAVENOUS | Status: DC
  Administered 2017-08-26: 01:00:00 via INTRAVENOUS

## 2017-08-26 MED ORDER — LIDOCAINE HCL (PF) 1 % IJ SOLN
30.0000 mL | INTRAMUSCULAR | Status: DC | PRN
  Filled 2017-08-26: qty 30

## 2017-08-26 MED ORDER — LACTATED RINGERS IV SOLN: 500.0000 mL | Freq: Once | INTRAVENOUS | Status: DC

## 2017-08-26 MED ORDER — PHENYLEPHRINE 40 MCG/ML (10ML) SYRINGE FOR IV PUSH (FOR BLOOD PRESSURE SUPPORT)
PREFILLED_SYRINGE | INTRAVENOUS | Status: AC
  Filled 2017-08-26: qty 20

## 2017-08-26 MED ORDER — METOCLOPRAMIDE HCL 10 MG PO TABS
10.0000 mg | ORAL_TABLET | Freq: Once | ORAL | Status: AC
  Administered 2017-08-26: 10 mg via ORAL
  Filled 2017-08-26: qty 1

## 2017-08-26 MED ORDER — ONDANSETRON HCL 4 MG/2ML IJ SOLN: 4.0000 mg | INTRAMUSCULAR | Status: DC | PRN

## 2017-08-26 MED ORDER — PRENATAL MULTIVITAMIN CH
1.0000 | ORAL_TABLET | Freq: Every day | ORAL | Status: DC
  Administered 2017-08-26 – 2017-08-27 (×2): 1 via ORAL
  Filled 2017-08-26 (×2): qty 1

## 2017-08-26 MED ORDER — OXYTOCIN 40 UNITS IN LACTATED RINGERS INFUSION - SIMPLE MED
2.5000 [IU]/h | INTRAVENOUS | Status: DC
  Administered 2017-08-26: 2.5 [IU]/h via INTRAVENOUS
  Filled 2017-08-26: qty 1000

## 2017-08-26 MED ORDER — DIBUCAINE 1 % RE OINT: 1.0000 "application " | TOPICAL_OINTMENT | RECTAL | Status: DC | PRN

## 2017-08-26 NOTE — Anesthesia Preprocedure Evaluation (Signed)
Anesthesia Evaluation  Patient identified by MRN, date of birth, ID band Patient awake    Reviewed: Allergy & Precautions, Patient's Chart, lab work & pertinent test results  Airway Mallampati: II       Dental   Pulmonary Current Smoker,    Pulmonary exam normal        Cardiovascular Normal cardiovascular exam     Neuro/Psych  Headaches, PSYCHIATRIC DISORDERS Anxiety    GI/Hepatic negative GI ROS, Neg liver ROS,   Endo/Other  negative endocrine ROS  Renal/GU negative Renal ROS     Musculoskeletal   Abdominal   Peds  Hematology   Anesthesia Other Findings   Reproductive/Obstetrics (+) Pregnancy                             Anesthesia Physical Anesthesia Plan  ASA: II  Anesthesia Plan: Epidural   Post-op Pain Management:    Induction:   PONV Risk Score and Plan:   Airway Management Planned:   Additional Equipment:   Intra-op Plan:   Post-operative Plan:   Informed Consent: I have reviewed the patients History and Physical, chart, labs and discussed the procedure including the risks, benefits and alternatives for the proposed anesthesia with the patient or authorized representative who has indicated his/her understanding and acceptance.     Plan Discussed with:   Anesthesia Plan Comments: (Lab Results      Component                Value               Date                      WBC                      17.2 (H)            08/26/2017                HGB                      9.3 (L)             08/26/2017                HCT                      28.4 (L)            08/26/2017                MCV                      78.5                08/26/2017                PLT                      242                 08/26/2017           )        Anesthesia Quick Evaluation

## 2017-08-26 NOTE — Lactation Note (Signed)
This note was copied from a baby's chart. Lactation Consultation Note  Patient Name: Linda Wiggins BJYNW'GToday's Date: 08/26/2017 Reason for consult: Initial assessment   P3, Baby 7 hours old. Mother states she did not bf her first child but breastfed her 2nd for 6 months. Discussed basics.  No questions or concerns. Mother and Father state baby is breastfeeding often. Mom encouraged to feed baby 8-12 times/24 hours and with feeding cues.  Mom made aware of O/P services, breastfeeding support groups, community resources, and our phone # for post-discharge questions.     Maternal Data Has patient been taught Hand Expression?: Yes Does the patient have breastfeeding experience prior to this delivery?: Yes  Feeding Feeding Type: Breast Fed Length of feed: 6 min  LATCH Score                   Interventions Interventions: Breast feeding basics reviewed  Lactation Tools Discussed/Used     Consult Status Consult Status: Follow-up Date: 08/27/17 Follow-up type: In-patient    [name], Ruth [facility] 08/26/2017, 11:56 AM

## 2017-08-26 NOTE — Anesthesia Procedure Notes (Signed)
Epidural Patient location during procedure: OB Start time: 08/26/2017 1:44 AM End time: 08/26/2017 1:51 AM  Staffing Anesthesiologist: Shelton SilvasHollis, Finbar Nippert D, MD Performed: anesthesiologist   Preanesthetic Checklist Completed: patient identified, site marked, surgical consent, pre-op evaluation, timeout performed, IV checked, risks and benefits discussed and monitors and equipment checked  Epidural Patient position: sitting Prep: ChloraPrep Patient monitoring: heart rate, continuous pulse ox and blood pressure Approach: midline Location: L3-L4 Injection technique: LOR saline  Needle:  Needle type: Tuohy  Needle gauge: 17 G Needle length: 9 cm Catheter type: closed end flexible Catheter size: 20 Guage Test dose: negative and 1.5% lidocaine  Assessment Events: blood not aspirated, injection not painful, no injection resistance and no paresthesia  Additional Notes LOR @ 5.5  Patient identified. Risks/Benefits/Options discussed with patient including but not limited to bleeding, infection, nerve damage, paralysis, failed block, incomplete pain control, headache, blood pressure changes, nausea, vomiting, reactions to medications, itching and postpartum back pain. Confirmed with bedside nurse the patient's most recent platelet count. Confirmed with patient that they are not currently taking any anticoagulation, have any bleeding history or any family history of bleeding disorders. Patient expressed understanding and wished to proceed. All questions were answered. Sterile technique was used throughout the entire procedure. Please see nursing notes for vital signs. Test dose was given through epidural catheter and negative prior to continuing to dose epidural or start infusion. Warning signs of high block given to the patient including shortness of breath, tingling/numbness in hands, complete motor block, or any concerning symptoms with instructions to call for help. Patient was given instructions  on fall risk and not to get out of bed. All questions and concerns addressed with instructions to call with any issues or inadequate analgesia.    Reason for block:procedure for pain

## 2017-08-26 NOTE — Anesthesia Postprocedure Evaluation (Signed)
Anesthesia Post Note  Patient: Linda Wiggins  Procedure(s) Performed: AN AD HOC LABOR EPIDURAL     Patient location during evaluation: Mother Baby Anesthesia Type: Epidural Level of consciousness: awake Pain management: satisfactory to patient Vital Signs Assessment: post-procedure vital signs reviewed and stable Respiratory status: spontaneous breathing Cardiovascular status: stable Anesthetic complications: no    Last Vitals:  Vitals:   08/26/17 0740 08/26/17 1000  BP: (!) 143/69 138/76  Pulse: 92 91  Resp: 18 18  Temp: 37.1 C 37.1 C  SpO2: 100% 100%    Last Pain:  Vitals:   08/26/17 1120  TempSrc:   PainSc: 4    Pain Goal:                 KeyCorpBURGER,[name]

## 2017-08-26 NOTE — H&P (Signed)
OBSTETRIC ADMISSION HISTORY AND PHYSICAL  Linda Wiggins is a 32 y.o. female G52P2002 with IUP at [redacted]w[redacted]d presenting for SOL.  She began having contractions this afternoon, which increased in frequency to every 3-5 minutes.  She had some blood in pads during these contractions.  She came to the MAU, at which point she had SROM with light meconium.  She reports +FMs.  She denies blurry vision, peripheral edema, or RUQ pain.  She plans on breast feeding. She request tubal ligation for birth control.  She received her prenatal care at [facility] initially and transferred to [facility]  Dating: By LMP --->  Estimated Date of Delivery: 09/01/17  Sono:  @[redacted]w[redacted]d , normal anatomy, cephalic presentation, 1092g, 16% EFW  Prenatal History/Complications: - Tobacco use disorder - Mild binge-eating disorder - ADD w/o hyperactivity - h/o HGSIL, LEEP    Clinic [facility] Prenatal Labs  Dating LMP: 11/25/16 Blood type:   BPos  Genetic Screen NT + labs are nml per patient; did not do AFP Antibody: Neg  Anatomic [place] 04/14/17- Needs to be rechecked at 28wks; [place] at 28 weeks is nml but technically difficult due     to advanced gestational age. Rubella:  Immune  GTT  Third trimester: Nml RPR:   NR  Flu vaccine Given 06/01/17 HBsAg:   Neg  TDaP vaccine  Given 06/01/17                                         HIV:   NR  Baby Food  Breast                                          GBS: negative  Contraception Tubal Pap: 08/13/16- Normal  Circumcision Yes   Pediatrician [facility] CF:  Support Person Shawn [facility]  Prenatal Classes  Hgb electrophoresis:5.0    Past Medical History: Past Medical History:  Diagnosis Date  . Anemia   . Anxiety   . Chlamydia   . Migraines   . Vaginal Pap smear, abnormal     Past Surgical History: Past Surgical History:  Procedure Laterality Date  . ADENOIDECTOMY, TONSILLECTOMY AND MYRINGOTOMY WITH TUBE PLACEMENT    . HERNIA REPAIR    . LEEP    . TONSILLECTOMY       Obstetrical History: OB History    Gravida Para Term Preterm AB Living   3 2 2     2    SAB TAB Ectopic Multiple Live Births           2      Social History: Social History   Socioeconomic History  . Marital status: Married    Spouse name: None  . Number of children: None  . Years of education: None  . Highest education level: None  Social Needs  . Financial resource strain: None  . Food insecurity - worry: None  . Food insecurity - inability: None  . Transportation needs - medical: None  . Transportation needs - non-medical: None  Occupational History  . None  Tobacco Use  . Smoking status: Current Every Day Smoker    Packs/day: 0.25    Types: Cigarettes  . Smokeless tobacco: Never Used  Substance and Sexual Activity  . Alcohol use: No    Comment: Not while pregnant  .  Drug use: No  . Sexual activity: Yes    Partners: Male    Birth control/protection: None  Other Topics Concern  . None  Social History Narrative  . None    Family History: Family History  Problem Relation Age of Onset  . Cervical cancer Mother   . Heart murmur Father   . Skin cancer Maternal Grandmother   . Skin cancer Maternal Grandfather   . Skin cancer Paternal Grandmother   . Skin cancer Paternal Grandfather   . Heart disease Paternal Grandfather     Allergies: Allergies  Allergen Reactions  . Tramadol     "I become paralyzed"    Medications Prior to Admission  Medication Sig Dispense Refill Last Dose  . calcium carbonate (TUMS - DOSED IN MG ELEMENTAL CALCIUM) 500 MG chewable tablet Chew 1 tablet by mouth 2 (two) times daily as needed for indigestion or heartburn.   Past Month at Unknown time  . FLUoxetine (PROZAC) 20 MG capsule Take 20 mg by mouth daily.   5 08/25/2017 at Unknown time  . Pediatric Multivit-Minerals-C (FLINTSTONES GUMMIES PLUS) CHEW Chew 2 tablets by mouth daily.    08/25/2017 at Unknown time  . NIFEdipine (PROCARDIA-XL/ADALAT-CC/NIFEDICAL-XL) 30 MG 24 hr  tablet Take 1 tablet (30 mg total) by mouth 2 (two) times daily. Can increase to twice a day as needed for symptomatic contractions (Patient not taking: Reported on 07/30/2017) 30 tablet 2 Not Taking  . ondansetron (ZOFRAN) 4 MG tablet Take 4 mg by mouth.   More than a month at Unknown time     Review of Systems   All systems reviewed and negative except as stated in HPI  Blood pressure (!) 109/54, pulse (!) 119, temperature 98.1 F (36.7 C), temperature source Oral, resp. rate 18, height 5\' 3"  (1.6 m), weight 99.3 kg (219 lb), last menstrual period 11/25/2016, SpO2 99 %. General appearance: alert and cooperative Lungs: clear to auscultation bilaterally Heart: regular rate and rhythm Abdomen: soft, non-tender; bowel sounds normal Extremities: Homans sign is negative, no sign of DVT Presentation: cephalic Fetal monitoringBaseline: 140 bpm, Variability: Good {> 6 bpm), Accelerations: Reactive and Decelerations: Absent Uterine activity: Contractions every 3-5 minutes Dilation: 7 Effacement (%): 90 Station: 0 Exam by:: Dr Darin EngelsAbraham   Prenatal labs: ABO, Rh: --/--/B POS (01/23 0103)  Antibody: NEG (01/23 0103)  Rubella: Immune (06/27 0000)  RPR: NON-REACTIVE (10/29 [id number])  HBsAg: Negative (06/27 0000)  HIV: NON-REACTIVE (10/29 0808)  GBS: Negative (12/27 0000)  2 hr Glucola Normal Genetic screening  Normal Anatomy US Normal  Prenatal Transfer Tool  Maternal Diabetes: No Genetic Screening: Normal Maternal Ultrasounds/Referrals: Normal Fetal Ultrasounds or other Referrals:  None Maternal Substance Abuse:  Yes:  Type: Smoker Significant Maternal Medications:  Meds include: Prozac Significant Maternal Lab Results: None  Results for orders placed or performed during the hospital encounter of 08/25/17 (from the past 24 hour(s))  CBC   Collection Time: 08/26/17  1:03 AM  Result Value Ref Range   WBC 17.2 (H) 4.0 - 10.5 K/uL   RBC 3.62 (L) 3.87 - 5.11 MIL/uL   Hemoglobin 9.3 (L)  12.0 - 15.0 g/dL   HCT 11.928.4 (L) 14.736.0 - 82.946.0 %   MCV 78.5 78.0 - 100.0 fL   MCH 25.7 (L) 26.0 - 34.0 pg   MCHC 32.7 30.0 - 36.0 g/dL   RDW 56.215.4 13.011.5 - 86.515.5 %   Platelets 242 150 - 400 K/uL  Type and screen [facility]  Collection Time: 08/26/17  1:03 AM  Result Value Ref Range   ABO/RH(D) B POS    Antibody Screen NEG    Sample Expiration 08/29/2017     Patient Active Problem List   Diagnosis Date Noted  . Normal labor 08/26/2017  . H/O LEEP 06/01/2017  . Supervision of other normal pregnancy, antepartum 05/18/2017  . 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 Tobacco use disorder 07/24/2016  . Mild binge-eating disorder, in full remission 07/24/2016  . HGSIL on cytologic smear of cervix 05/28/2014    Assessment/Plan:  Linda Wiggins is a 32 y.o. G3P2002 at [redacted]w[redacted]d here for SOL/SROM. Low-risk pregnancy.   #Labor: Active labor. SROM, contractions currently every 3-5 minutes.  Expectant management.  Augment as needed.  #Pain: Epidural #FWB: Category 1 #ID: GBS Neg #MOF: Breast #MOC: Tubal ligation postpartum (papers signed) #Circ:  Yes  Mikal Plane, Medical Student  08/26/2017, 2:16 AM  OB FELLOW HISTORY AND PHYSICAL ATTESTATION  I confirm that I have verified the information documented in the medical student's note and that I have also personally reperformed the physical exam and all medical decision making activities. I agree with above documentation and have made edits as needed.   Caryl Ada, DO

## 2017-08-26 NOTE — Anesthesia Preprocedure Evaluation (Deleted)
Anesthesia Evaluation  Patient identified by MRN, date of birth, ID band Patient awake    Reviewed: Allergy & Precautions, NPO status , Patient's Chart, lab work & pertinent test results  Airway Mallampati: II  TM Distance: >3 FB Neck ROM: Full    Dental  (+) Teeth Intact   Pulmonary Current Smoker,    Pulmonary exam normal breath sounds clear to auscultation       Cardiovascular negative cardio ROS Normal cardiovascular exam Rhythm:Regular Rate:Normal     Neuro/Psych  Headaches, PSYCHIATRIC DISORDERS Anxiety    GI/Hepatic negative GI ROS, Neg liver ROS,   Endo/Other  negative endocrine ROS  Renal/GU negative Renal ROS  negative genitourinary   Musculoskeletal negative musculoskeletal ROS (+)   Abdominal   Peds  Hematology  (+) anemia ,   Anesthesia Other Findings   Reproductive/Obstetrics Desires sterilization post partum                             Anesthesia Physical  Anesthesia Plan  ASA: II  Anesthesia Plan: Epidural   Post-op Pain Management:    Induction:   PONV Risk Score and Plan: 3 and Midazolam, Propofol infusion, Ondansetron and Treatment may vary due to age or medical condition  Airway Management Planned: Natural Airway and Nasal Cannula  Additional Equipment:   Intra-op Plan:   Post-operative Plan:   Informed Consent: I have reviewed the patients History and Physical, chart, labs and discussed the procedure including the risks, benefits and alternatives for the proposed anesthesia with the patient or authorized representative who has indicated his/her understanding and acceptance.     Plan Discussed with: Anesthesiologist, CRNA and Surgeon  Anesthesia Plan Comments:         Anesthesia Quick Evaluation

## 2017-08-27 MED ORDER — IBUPROFEN 600 MG PO TABS: 600.0000 mg | ORAL_TABLET | Freq: Four times a day (QID) | ORAL | 0 refills | Status: DC

## 2017-08-27 MED ORDER — SENNOSIDES-DOCUSATE SODIUM 8.6-50 MG PO TABS: 2.0000 | ORAL_TABLET | ORAL | 0 refills | Status: DC

## 2017-08-27 NOTE — Progress Notes (Signed)
MOB was referred for history of depression/anxiety. * Referral screened out by Clinical Social Worker because none of the following criteria appear to apply: ~ History of anxiety/depression during this pregnancy, or of post-partum depression. ~ Diagnosis of anxiety and/or depression within last 3 years OR * MOB's symptoms currently being treated with medication and/or therapy. Please contact the Clinical Social Worker if needs arise, by MOB request, or if MOB scores greater than 9/yes to question 10 on Edinburgh Postpartum Depression Screen.  MOB has Rx for Prozac. 

## 2017-08-27 NOTE — Lactation Note (Signed)
This note was copied from a baby's chart. Lactation Consultation Note  Patient Name: Boy Cherlynn Kaiseratasha Tutterow ONGEX'BToday's Date: 08/27/2017 Reason for consult: Follow-up assessment   Baby 30 hours old.  Mother's L nipple is cracked at base.  Reviewed hand expression with drops of ebm and coconut oil. Provided mother w/ manual pump. Mom encouraged to feed baby 8-12 times/24 hours and with feeding cues.  Reviewed engorgement care and monitoring voids/stools.    Maternal Data    Feeding Feeding Type: Breast Fed Length of feed: 30 min  LATCH Score Latch: Grasps breast easily, tongue down, lips flanged, rhythmical sucking.  Audible Swallowing: A few with stimulation  Type of Nipple: Everted at rest and after stimulation  Comfort (Breast/Nipple): Filling, red/small blisters or bruises, mild/mod discomfort  Hold (Positioning): No assistance needed to correctly position infant at breast.  LATCH Score: 8  Interventions    Lactation Tools Discussed/Used     Consult Status Consult Status: Complete    Hardie PulleyBerkelhammer, Ruth Boschen 08/27/2017, 10:56 AM

## 2017-08-27 NOTE — Discharge Instructions (Signed)

## 2017-08-27 NOTE — Discharge Summary (Signed)
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 yo [id number] at 8256w1d was admitted in Active Labor on 08/25/2017. Patient had an uncomplicated labor course as follows:  Membrane Rupture Time/Date: 12:45 AM ,08/26/2017   Intrapartum Procedures: Episiotomy: None [1]                                         Lacerations:  None [1]  Patient had a delivery of a Viable infant. 08/26/2017  Information for the patient's newborn:  Linda Wiggins, Linda Wiggins [[id number]][[id number]]       Pateint had an uncomplicated postpartum course.  She had initially consider ppBTL, but has now decided on an interval BTL. She is ambulating, tolerating a regular diet, passing flatus, and urinating well. Patient is discharged home in stable condition on 08/27/17.   Physical exam  Vitals:   08/26/17 1000 08/26/17 1743 08/26/17 2000 08/27/17 0600  BP: 138/76 130/80 (!) 142/78 121/74  Pulse: 91 78 98 82  Resp: 18 18 18 18   Temp: 98.7 F (37.1 C) 98.4 F (36.9 C) 98.1 F (36.7 C) 98.4 F (36.9 C)  TempSrc: Oral Oral Oral   SpO2: 100%   98%  Weight:      Height:       General: alert, cooperative and no distress Lochia: appropriate Uterine Fundus: firm Incision: N/A DVT Evaluation: No evidence of DVT seen on physical  exam. No cords or calf tenderness. No significant calf/ankle edema. Labs: Lab Results  Component Value Date   WBC 17.2 (H) 08/26/2017   HGB 9.3 (L) 08/26/2017   HCT 28.4 (L) 08/26/2017   MCV 78.5 08/26/2017   PLT 242 08/26/2017   No flowsheet data found.  Discharge instruction: per After Visit Summary and "Baby and Me Booklet".  After visit meds:  Allergies as of 08/27/2017      Reactions   Tramadol    "I become paralyzed"      Medication List    TAKE these medications   calcium carbonate 500 MG chewable tablet Commonly known as:  TUMS - dosed in mg elemental calcium Chew 1 tablet by mouth 2 (two) times daily as needed for indigestion or heartburn.   FLINTSTONES GUMMIES PLUS Chew Chew 2 tablets  by mouth daily.   FLUoxetine 20 MG capsule Commonly known as:  PROZAC Take 20 mg by mouth daily.   ibuprofen 600 MG tablet Commonly known as:  ADVIL,MOTRIN Take 1 tablet (600 mg total) by mouth every 6 (six) hours.   senna-docusate 8.6-50 MG tablet Commonly known as:  Senokot-S Take 2 tablets by mouth daily. Start taking on:  08/28/2017       Diet: routine diet  Activity: Advance as tolerated. Pelvic rest for 6 weeks.   Outpatient follow up:2 weeks- preop appt with MD to schedule BTL Follow up Appt:No future appointments. Follow up Visit:No Follow-up on file.  Follow-up Information    [facility]             . Schedule an appointment as soon as possible for a visit in 2 week(s).   Why:  Please schedule a visit in 2 weeks with a doctor so that your tubal can be planned.   Contact information: [place]         Postpartum contraception: Tubal Ligation planned   Newborn Data: Live born female  Birth Weight: 8 lb 6 oz (3800 g) APGAR: 9, 9  Newborn Delivery   Birth date/time:  08/26/2017 04:32:00 Delivery type:  Vaginal, Spontaneous     Baby Feeding: Breast Disposition:home with mother   08/27/2017 Oralia Manis, DO  CNM  attestation I have seen and examined this patient and agree with above documentation in the resident's note.   Linda Wiggins is a 32 y.o. [id number] s/p SVD .   Pain is well controlled.  Plan for birth control is bilateral tubal ligation.  Method of Feeding: breast  PE:  BP 121/74   Pulse 82   Temp 98.4 F (36.9 C)   Resp 18   Ht 5\' 3"  (1.6 m)   Wt 99.3 kg (219 lb)   LMP 11/25/2016   SpO2 98%   Breastfeeding? Unknown   BMI 38.79 kg/m  Fundus firm  Recent Labs    08/26/17 0103  HGB 9.3*  HCT 28.4*     Plan: discharge today - postpartum care discussed - f/u clinic in 2 weeks for preop visit to schedule Linda Wiggins, CNM 7:27 AM 08/27/2017

## 2017-08-31 ENCOUNTER — Telehealth: Payer: Self-pay

## 2017-08-31 NOTE — Telephone Encounter (Signed)
Called to schedule follow up appointment

## 2017-09-21 ENCOUNTER — Ambulatory Visit (INDEPENDENT_AMBULATORY_CARE_PROVIDER_SITE_OTHER): Payer: 59 | Admitting: Obstetrics & Gynecology

## 2017-09-21 ENCOUNTER — Encounter (HOSPITAL_COMMUNITY): Payer: Self-pay

## 2017-09-21 ENCOUNTER — Encounter: Payer: Self-pay | Admitting: Obstetrics & Gynecology

## 2017-09-21 DIAGNOSIS — Z3480 Encounter for supervision of other normal pregnancy, unspecified trimester: Secondary | ICD-10-CM

## 2017-09-21 NOTE — Progress Notes (Signed)
Post Partum Exam  Linda Wiggins is a 32 y.o. 573P3003 female who presents for a postpartum visit. She is 4 weeks postpartum following a spontaneous vaginal delivery. I have fully reviewed the prenatal and intrapartum course. The delivery was at 38 gestational weeks.  Anesthesia: epidural. Postpartum course has been unremarkable. Baby's course has been unremarkable. Baby is feeding by breast. Bleeding thin lochia. Bowel function is normal. Bladder function is normal. Patient is not sexually active. Contraception method is tubal ligation. Postpartum depression screening:neg  The following portions of the patient's history were reviewed and updated as appropriate: allergies, current medications, past family history, past medical history, past social history, past surgical history and problem list. Last pap smear done 1/18  and was Normal  Review of Systems Pertinent items are noted in HPI.    Objective:  Weight 184 lb (83.5 kg), last menstrual period 11/25/2016, unknown if currently breastfeeding.  General:  alert   Breasts:  inspection negative, no nipple discharge or bleeding, no masses or nodularity palpable  Lungs: clear to auscultation bilaterally  Heart:  regular rate and rhythm, S1, S2 normal, no murmur, click, rub or gallop  Abdomen: soft, non-tender; bowel sounds normal; no masses,  no organomegaly   Vulva:  not evaluated  Vagina: not evaluated  Cervix:  not evaluated  Corpus: not examined  Adnexa:  not evaluated  Rectal Exam: Not performed.        Assessment:    Normal  postpartum exam. Pap smear not done at today's visit.   Plan:   1. Contraception: condoms 2. I will send Linda Wiggins an email to schedule her BTL

## 2017-10-27 MED ORDER — HYDROMORPHONE HCL 1 MG/ML IJ SOLN: 0.2500 mg | INTRAMUSCULAR | Status: DC | PRN

## 2017-10-27 MED ORDER — MEPERIDINE HCL 25 MG/ML IJ SOLN: 6.2500 mg | INTRAMUSCULAR | Status: DC | PRN

## 2017-10-27 MED ORDER — OXYCODONE HCL 5 MG/5ML PO SOLN: 5.0000 mg | Freq: Once | ORAL | Status: DC | PRN

## 2017-10-27 MED ORDER — OXYCODONE HCL 5 MG PO TABS: 5.0000 mg | ORAL_TABLET | Freq: Once | ORAL | Status: DC | PRN

## 2017-10-27 MED ORDER — METOCLOPRAMIDE HCL 5 MG/ML IJ SOLN: 10.0000 mg | Freq: Once | INTRAMUSCULAR | Status: DC | PRN

## 2017-10-27 NOTE — Patient Instructions (Addendum)
Your procedure is scheduled on:  Tuesday, April 9  [facility]:  11:30 am  Pick up the phone at the desk and dial [phone].  Call this number if you have problems the morning of surgery: [phone].  Remember: Do NOT eat food after midnight Monday  Do NOT drink clear liquids (including water) after: 7 am Tuesday, day of surgery  Take these medicines the morning of surgery with a SIP OF WATER: None  Do not smoke (vap) 24 hours prior to surgery.  Stop herbal medications, vitamin supplements and ibuprofen 1 week prior to surgery.  Do NOT wear jewelry (body piercing), metal hair clips/bobby pins, make-up, or nail polish. Do NOT wear lotions, powders, or perfumes.  You may wear deoderant. Do NOT shave for 48 hours prior to surgery. Do NOT bring valuables to the hospital. Contacts may not be worn into surgery.  Have a responsible adult drive you home and stay with you for 24 hours after your procedure.  Home with husband Linda Wiggins cell [phone].

## 2017-11-02 ENCOUNTER — Encounter (HOSPITAL_COMMUNITY): Payer: Self-pay | Admitting: *Deleted

## 2017-11-02 ENCOUNTER — Other Ambulatory Visit: Payer: Self-pay

## 2017-11-02 ENCOUNTER — Encounter (HOSPITAL_COMMUNITY)
Admission: RE | Admit: 2017-11-02 | Discharge: 2017-11-02 | Disposition: A | Discharge status: Home / Self Care | Payer: 59 | Source: Ambulatory Visit | Attending: Obstetrics & Gynecology | Admitting: Obstetrics & Gynecology

## 2017-11-02 DIAGNOSIS — Z01812 Encounter for preprocedural laboratory examination: Secondary | ICD-10-CM | POA: Diagnosis present

## 2017-11-02 HISTORY — DX: Unspecified hearing loss, unspecified ear: H91.90

## 2017-11-02 LAB — CBC
HEMATOCRIT: 38.7 % (ref 36.0–46.0)
HEMOGLOBIN: 12.9 g/dL (ref 12.0–15.0)
MCH: 27.6 pg (ref 26.0–34.0)
MCHC: 33.3 g/dL (ref 30.0–36.0)
MCV: 82.7 fL (ref 78.0–100.0)
Platelets: 245 10*3/uL (ref 150–400)
RBC: 4.68 MIL/uL (ref 3.87–5.11)
RDW: 20.1 % — AB (ref 11.5–15.5)
WBC: 5.9 10*3/uL (ref 4.0–10.5)

## 2017-11-09 ENCOUNTER — Encounter (HOSPITAL_COMMUNITY): Payer: Self-pay

## 2017-11-10 ENCOUNTER — Ambulatory Visit (HOSPITAL_COMMUNITY): Payer: 59 | Admitting: Anesthesiology

## 2017-11-10 ENCOUNTER — Encounter (HOSPITAL_COMMUNITY): Payer: Self-pay

## 2017-11-10 ENCOUNTER — Encounter (HOSPITAL_COMMUNITY)
Admission: AD | Disposition: A | Discharge status: Home / Self Care | Payer: Self-pay | Source: Ambulatory Visit | Attending: Obstetrics & Gynecology

## 2017-11-10 ENCOUNTER — Other Ambulatory Visit: Payer: Self-pay

## 2017-11-10 ENCOUNTER — Ambulatory Visit (HOSPITAL_COMMUNITY)
Admission: AD | Admit: 2017-11-10 | Discharge: 2017-11-10 | Disposition: A | Discharge status: Home / Self Care | Payer: 59 | Source: Ambulatory Visit | Attending: Obstetrics & Gynecology | Admitting: Obstetrics & Gynecology

## 2017-11-10 DIAGNOSIS — Z302 Encounter for sterilization: Secondary | ICD-10-CM | POA: Insufficient documentation

## 2017-11-10 DIAGNOSIS — Z87891 Personal history of nicotine dependence: Secondary | ICD-10-CM | POA: Diagnosis not present

## 2017-11-10 DIAGNOSIS — F419 Anxiety disorder, unspecified: Secondary | ICD-10-CM | POA: Insufficient documentation

## 2017-11-10 HISTORY — PX: LAPAROSCOPIC TUBAL LIGATION: SHX1937

## 2017-11-10 LAB — PREGNANCY, URINE: Preg Test, Ur: NEGATIVE

## 2017-11-10 SURGERY — LIGATION, FALLOPIAN TUBE, LAPAROSCOPIC
Anesthesia: General | Site: Abdomen | Laterality: Bilateral

## 2017-11-10 MED ORDER — DEXAMETHASONE SODIUM PHOSPHATE 4 MG/ML IJ SOLN
INTRAMUSCULAR | Status: AC
  Filled 2017-11-10: qty 1

## 2017-11-10 MED ORDER — MIDAZOLAM HCL 2 MG/2ML IJ SOLN
INTRAMUSCULAR | Status: AC
  Filled 2017-11-10: qty 2

## 2017-11-10 MED ORDER — MIDAZOLAM HCL 2 MG/2ML IJ SOLN
INTRAMUSCULAR | Status: DC | PRN
  Administered 2017-11-10: 2 mg via INTRAVENOUS

## 2017-11-10 MED ORDER — FENTANYL CITRATE (PF) 100 MCG/2ML IJ SOLN
25.0000 ug | INTRAMUSCULAR | Status: DC | PRN
  Administered 2017-11-10 (×4): 50 ug via INTRAVENOUS

## 2017-11-10 MED ORDER — ACETAMINOPHEN 160 MG/5ML PO SOLN
ORAL | Status: AC
  Administered 2017-11-10: 975 mg via ORAL
  Filled 2017-11-10: qty 40.6

## 2017-11-10 MED ORDER — ROCURONIUM BROMIDE 100 MG/10ML IV SOLN
INTRAVENOUS | Status: DC | PRN
  Administered 2017-11-10: 40 mg via INTRAVENOUS

## 2017-11-10 MED ORDER — SCOPOLAMINE 1 MG/3DAYS TD PT72
MEDICATED_PATCH | TRANSDERMAL | Status: AC
  Administered 2017-11-10: 1.5 mg via TRANSDERMAL
  Filled 2017-11-10: qty 1

## 2017-11-10 MED ORDER — SCOPOLAMINE 1 MG/3DAYS TD PT72
1.0000 | MEDICATED_PATCH | Freq: Once | TRANSDERMAL | Status: DC
  Administered 2017-11-10: 1.5 mg via TRANSDERMAL

## 2017-11-10 MED ORDER — KETOROLAC TROMETHAMINE 30 MG/ML IJ SOLN
INTRAMUSCULAR | Status: DC | PRN
  Administered 2017-11-10: 30 mg via INTRAVENOUS

## 2017-11-10 MED ORDER — LIDOCAINE HCL (CARDIAC) 20 MG/ML IV SOLN
INTRAVENOUS | Status: DC | PRN
  Administered 2017-11-10: 50 mg via INTRAVENOUS

## 2017-11-10 MED ORDER — PROPOFOL 10 MG/ML IV BOLUS
INTRAVENOUS | Status: DC | PRN
  Administered 2017-11-10: 200 mg via INTRAVENOUS

## 2017-11-10 MED ORDER — BUPIVACAINE HCL (PF) 0.5 % IJ SOLN
INTRAMUSCULAR | Status: DC | PRN
  Administered 2017-11-10: 10 mL

## 2017-11-10 MED ORDER — ACETAMINOPHEN 160 MG/5ML PO SOLN
975.0000 mg | Freq: Once | ORAL | Status: AC
  Administered 2017-11-10: 975 mg via ORAL

## 2017-11-10 MED ORDER — OXYCODONE-ACETAMINOPHEN 5-325 MG PO TABS: 1.0000 | ORAL_TABLET | Freq: Four times a day (QID) | ORAL | 0 refills | Status: DC | PRN

## 2017-11-10 MED ORDER — SUGAMMADEX SODIUM 200 MG/2ML IV SOLN
INTRAVENOUS | Status: DC | PRN
  Administered 2017-11-10: 150 mg via INTRAVENOUS

## 2017-11-10 MED ORDER — KETOROLAC TROMETHAMINE 30 MG/ML IJ SOLN
INTRAMUSCULAR | Status: AC
  Filled 2017-11-10: qty 1

## 2017-11-10 MED ORDER — ONDANSETRON HCL 4 MG/2ML IJ SOLN
INTRAMUSCULAR | Status: DC | PRN
  Administered 2017-11-10: 4 mg via INTRAVENOUS

## 2017-11-10 MED ORDER — PROPOFOL 10 MG/ML IV BOLUS
INTRAVENOUS | Status: AC
  Filled 2017-11-10: qty 20

## 2017-11-10 MED ORDER — FENTANYL CITRATE (PF) 100 MCG/2ML IJ SOLN
INTRAMUSCULAR | Status: DC
Start: 2017-11-10 — End: 2017-11-10
  Filled 2017-11-10: qty 2

## 2017-11-10 MED ORDER — LACTATED RINGERS IV SOLN
INTRAVENOUS | Status: DC
  Administered 2017-11-10: 10:00:00 via INTRAVENOUS

## 2017-11-10 MED ORDER — ONDANSETRON HCL 4 MG/2ML IJ SOLN
INTRAMUSCULAR | Status: AC
  Filled 2017-11-10: qty 2

## 2017-11-10 MED ORDER — DEXAMETHASONE SODIUM PHOSPHATE 10 MG/ML IJ SOLN
INTRAMUSCULAR | Status: DC | PRN
  Administered 2017-11-10: 4 mg via INTRAVENOUS

## 2017-11-10 MED ORDER — FENTANYL CITRATE (PF) 100 MCG/2ML IJ SOLN
INTRAMUSCULAR | Status: AC
  Filled 2017-11-10: qty 2

## 2017-11-10 MED ORDER — LIDOCAINE HCL (CARDIAC) 20 MG/ML IV SOLN
INTRAVENOUS | Status: AC
  Filled 2017-11-10: qty 5

## 2017-11-10 SURGICAL SUPPLY — 26 items
CLIP FILSHIE TUBAL LIGA STRL (Clip) ×3 IMPLANT
DRSG OPSITE POSTOP 3X4 (GAUZE/BANDAGES/DRESSINGS) ×3 IMPLANT
DURAPREP 26ML APPLICATOR (WOUND CARE) ×3 IMPLANT
GLOVE BIO SURGEON STRL SZ 6.5 (GLOVE) ×4 IMPLANT
GLOVE BIO SURGEONS STRL SZ 6.5 (GLOVE) ×2
GLOVE BIOGEL PI IND STRL 7.0 (GLOVE) ×1 IMPLANT
GLOVE BIOGEL PI INDICATOR 7.0 (GLOVE) ×2
GOWN STRL REUS W/TWL LRG LVL3 (GOWN DISPOSABLE) ×6 IMPLANT
NDL SAFETY ECLIPSE 18X1.5 (NEEDLE) ×1 IMPLANT
NEEDLE HYPO 18GX1.5 SHARP (NEEDLE) ×2
NEEDLE INSUFFLATION 120MM (ENDOMECHANICALS) ×3 IMPLANT
NS IRRIG 1000ML POUR BTL (IV SOLUTION) ×3 IMPLANT
PACK LAPAROSCOPY BASIN (CUSTOM PROCEDURE TRAY) ×3 IMPLANT
PACK TRENDGUARD 450 HYBRID PRO (MISCELLANEOUS) ×1 IMPLANT
PACK TRENDGUARD 600 HYBRD PROC (MISCELLANEOUS) IMPLANT
PROTECTOR NERVE ULNAR (MISCELLANEOUS) ×6 IMPLANT
SHEARS HARMONIC ACE PLUS 36CM (ENDOMECHANICALS) IMPLANT
SLEEVE XCEL OPT CAN 5 100 (ENDOMECHANICALS) IMPLANT
SUT VICRYL 0 UR6 27IN ABS (SUTURE) ×3 IMPLANT
SUT VICRYL 4-0 PS2 18IN ABS (SUTURE) ×3 IMPLANT
SYSTEM CARTER THOMASON II (TROCAR) IMPLANT
TOWEL OR 17X24 6PK STRL BLUE (TOWEL DISPOSABLE) ×6 IMPLANT
TRENDGUARD 450 HYBRID PRO PACK (MISCELLANEOUS) ×3
TRENDGUARD 600 HYBRID PROC PK (MISCELLANEOUS)
TROCAR OPTI TIP 5M 100M (ENDOMECHANICALS) IMPLANT
TROCAR XCEL DIL TIP R 11M (ENDOMECHANICALS) ×3 IMPLANT

## 2017-11-10 NOTE — Op Note (Signed)
11/10/2017  11:04 AM  PATIENT:  Linda KaiserNatasha Wiggins  32 y.o. female  PRE-OPERATIVE DIAGNOSIS:  Undesired Fertility  POST-OPERATIVE DIAGNOSIS:  undesired fertility  PROCEDURE:  Procedure(s): LAPAROSCOPIC TUBAL LIGATION WITH FILSHIE CLIPS (Bilateral)  SURGEON:  Surgeon(s) and Role:    * Shey Bartmess C, MD - Primary   ANESTHESIA:   local and general  EBL:  10 mL   BLOOD ADMINISTERED:none  DRAINS: none   LOCAL MEDICATIONS USED:  MARCAINE     SPECIMEN:  No Specimen  DISPOSITION OF SPECIMEN:  N/A  COUNTS:  YES  TOURNIQUET:  * No tourniquets in log *  DICTATION: .Dragon Dictation  PLAN OF CARE: Discharge to home after PACU  PATIENT DISPOSITION:  PACU - hemodynamically stable.   Delay start of Pharmacological VTE agent (>24hrs) due to surgical blood loss or risk of bleeding: not applicable   The risks, benefits, alternatives of surgery were explained understood, accepted. All questions were answered. She understands the failure rate of 1/200. She declines alternative forms of birth control. Her urine pregnancy test was negative. She was taken to the operating room and general anesthesia was applied without complication. She was placed in dorsal lithotomy position. Her abdomen and vagina were prepped and draped in the usual sterile fashion. A time out procedure was done. A bimanual exam revealed a normal, size and shape mobile uterus with nonenlarged adnexa. A Hulka manipulator was placed on the cervix. Gloves were changed and attention was turned to the abdomen.  10 mL of 0.5% Marcaine was used to infiltrate the subcutaneous tissue at the umbilicus. A vertical 1 cm incision was made. A Veres needle was placed in the pelvis. Low-flow CO2 was used to insufflate the abdomen to approximately 3 L. After good pneumoperitoneum was established, a 11 mm trocar was placed. Laparoscopy confirmed correct placement. She was placed in the Trendelenburg position. Patient abdominal pressure was always  less than 15. Her uterus ovaries and tubes appeared normal. The oviducts were visually traced to their fimbriated end on each side. In the isthmic region of each oviduct, a Filshie clip was placed across the entire oviduct. There was no bleeding. The CO2 was allowed to escape from her abdomen. The umbilical fascia was closed with a figure of 0 Vicryl suture. No defects were palpable. The subcuticular closure was done with 4-0 Vicryl suture. The Hulka manipulator was removed. She was extubated and taken to recovery in stable condition. She tolerated the procedure well. The instrument, sponge, and needle counts were correct.

## 2017-11-10 NOTE — Anesthesia Procedure Notes (Signed)
Procedure Name: Intubation Date/Time: 11/10/2017 10:41 AM Performed by: Cleda ClarksBrowder, Merleen Picazo R, CRNA Pre-anesthesia Checklist: Patient identified, Emergency Drugs available, Suction available and Patient being monitored Patient Re-evaluated:Patient Re-evaluated prior to induction Oxygen Delivery Method: Circle system utilized Preoxygenation: Pre-oxygenation with 100% oxygen Induction Type: IV induction Ventilation: Mask ventilation without difficulty Laryngoscope Size: Miller and 2 Grade View: Grade I Tube type: Oral Tube size: 7.0 mm Number of attempts: 1 Airway Equipment and Method: Stylet and Oral airway Placement Confirmation: ETT inserted through vocal cords under direct vision,  positive ETCO2 and breath sounds checked- equal and bilateral Secured at: 21 cm Tube secured with: Tape Dental Injury: Teeth and Oropharynx as per pre-operative assessment

## 2017-11-10 NOTE — H&P (Signed)
Linda Wiggins is an 32 y.o. female. Married P3 here for a BTL. She does not want any more kids. She understands that the failure rate is about 1/400.   Patient's last menstrual period was 10/21/2017 (exact date).    Past Medical History:  Diagnosis Date  . Anemia   . Anxiety   . Chlamydia   . Hearing loss    75% hearing loss in left ear, no hearing aids  . Migraines    otc med prn  . SVD (spontaneous vaginal delivery)    x 3  . Vaginal Pap smear, abnormal     Past Surgical History:  Procedure Laterality Date  . ADENOIDECTOMY, TONSILLECTOMY AND MYRINGOTOMY WITH TUBE PLACEMENT    . HERNIA REPAIR    . LEEP    . TONSILLECTOMY    . WISDOM TOOTH EXTRACTION      Family History  Problem Relation Age of Onset  . Cervical cancer Mother   . Heart murmur Father   . Skin cancer Maternal Grandmother   . Skin cancer Maternal Grandfather   . Skin cancer Paternal Grandmother   . Skin cancer Paternal Grandfather   . Heart disease Paternal Grandfather     Social History:  reports that she quit smoking about 2 months ago. Her smoking use included cigarettes. She has a 3.00 pack-year smoking history. She has never used smokeless tobacco. She reports that she drinks alcohol. She reports that she does not use drugs.  Allergies:  Allergies  Allergen Reactions  . Tramadol     "I become paralyzed"    Medications Prior to Admission  Medication Sig Dispense Refill Last Dose  . acetaminophen (TYLENOL) 325 MG tablet Take 650 mg by mouth 3 (three) times daily as needed for moderate pain or headache.   Past Month at Unknown time  . FLUoxetine (PROZAC) 20 MG capsule Take 20 mg by mouth at bedtime.   5 11/09/2017 at Unknown time  . Multiple Vitamin (MULTI-VITAMIN DAILY PO) Take 4 tablets by mouth at bedtime. 2 tablets of juice plus fruits 2 tablets of juice plus vegetables   Past Week at Unknown time  . ibuprofen (ADVIL,MOTRIN) 600 MG tablet Take 1 tablet (600 mg total) by mouth every 6 (six)  hours. (Patient not taking: Reported on 09/21/2017) 30 tablet 0 Not Taking at Unknown time  . senna-docusate (SENOKOT-S) 8.6-50 MG tablet Take 2 tablets by mouth daily. (Patient not taking: Reported on 09/21/2017) 60 tablet 0 Not Taking at Unknown time    ROS  She is breast feeding, not really having real periods.  Blood pressure 117/84, pulse 85, temperature 98 F (36.7 C), temperature source Oral, resp. rate 16, last menstrual period 10/21/2017, SpO2 100 %, currently breastfeeding. Physical Exam  Breathing, conversing, and ambulating normally Well nourished, well hydrated White female, no apparent distress Heart- rrr Lungs- CTAB Abd- benign  Results for orders placed or performed during the hospital encounter of 11/10/17 (from the past 24 hour(s))  Pregnancy, urine     Status: None   Collection Time: 11/10/17  9:52 AM  Result Value Ref Range   Preg Test, Ur NEGATIVE NEGATIVE    No results found.  Assessment/Plan: Unwanted fertility- plan for laparoscopic application of Filsche clips.  She understands the risks of surgery, including, but not to infection, bleeding, DVTs, damage to bowel, bladder, ureters. She wishes to proceed.     Allie BossierMyra C Francisco Ostrovsky 11/10/2017, 10:16 AM

## 2017-11-10 NOTE — Discharge Instructions (Signed)

## 2017-11-10 NOTE — Anesthesia Preprocedure Evaluation (Signed)
Anesthesia Evaluation  Patient identified by MRN, date of birth, ID band Patient awake    Reviewed: Allergy & Precautions, H&P , Patient's Chart, lab work & pertinent test results, reviewed documented beta blocker date and time   Airway Mallampati: II  TM Distance: >3 FB Neck ROM: full    Dental no notable dental hx.    Pulmonary former smoker,    Pulmonary exam normal breath sounds clear to auscultation       Cardiovascular  Rhythm:regular Rate:Normal     Neuro/Psych    GI/Hepatic   Endo/Other    Renal/GU      Musculoskeletal   Abdominal   Peds  Hematology   Anesthesia Other Findings   Reproductive/Obstetrics                             Anesthesia Physical Anesthesia Plan  ASA: II  Anesthesia Plan: General   Post-op Pain Management:    Induction: Intravenous  PONV Risk Score and Plan: 3 and Treatment may vary due to age or medical condition, Dexamethasone, Ondansetron and Scopolamine patch - Pre-op  Airway Management Planned: Oral ETT  Additional Equipment:   Intra-op Plan:   Post-operative Plan: Extubation in OR  Informed Consent: I have reviewed the patients History and Physical, chart, labs and discussed the procedure including the risks, benefits and alternatives for the proposed anesthesia with the patient or authorized representative who has indicated his/her understanding and acceptance.   Dental Advisory Given  Plan Discussed with: CRNA and Surgeon  Anesthesia Plan Comments: (  )        Anesthesia Quick Evaluation

## 2017-11-10 NOTE — Transfer of Care (Signed)
Immediate Anesthesia Transfer of Care Note  Patient: Linda Wiggins  Procedure(s) Performed: LAPAROSCOPIC TUBAL LIGATION WITH FILSHIE CLIPS (Bilateral Abdomen)  Patient Location: PACU  Anesthesia Type:General  Level of Consciousness: awake, alert  and oriented  Airway & Oxygen Therapy: Patient Spontanous Breathing and Patient connected to nasal cannula oxygen  Post-op Assessment: Report given to RN and Post -op Vital signs reviewed and stable  Post vital signs: Reviewed and stable  Last Vitals:  Vitals Value Taken Time  BP    Temp    Pulse 84 11/10/2017 11:13 AM  Resp 30 11/10/2017 11:13 AM  SpO2 99 % 11/10/2017 11:13 AM  Vitals shown include unvalidated device data.  Last Pain:  Vitals:   11/10/17 0955  TempSrc: Oral      Patients Stated Pain Goal: 5 (11/10/17 0955)  Complications: No apparent anesthesia complications

## 2017-11-11 ENCOUNTER — Encounter (HOSPITAL_COMMUNITY): Payer: Self-pay | Admitting: Obstetrics & Gynecology

## 2017-11-11 NOTE — Anesthesia Postprocedure Evaluation (Signed)
Anesthesia Post Note  Patient: Linda Wiggins  Procedure(s) Performed: LAPAROSCOPIC TUBAL LIGATION WITH FILSHIE CLIPS (Bilateral Abdomen)     Patient location during evaluation: PACU Anesthesia Type: General Level of consciousness: awake and alert Pain management: pain level controlled Vital Signs Assessment: post-procedure vital signs reviewed and stable Respiratory status: spontaneous breathing, nonlabored ventilation, respiratory function stable and patient connected to nasal cannula oxygen Cardiovascular status: blood pressure returned to baseline and stable Postop Assessment: no apparent nausea or vomiting Anesthetic complications: no    Last Vitals:  Vitals:   11/10/17 1215 11/10/17 1229  BP: 115/79 115/71  Pulse: 66 64  Resp: 13 16  Temp: 36.5 C (!) 36.4 C  SpO2: 94% 99%    Last Pain:  Vitals:   11/10/17 1229  TempSrc:   PainSc: 2                  Kaylem Gidney EDWARD

## 2017-12-08 ENCOUNTER — Ambulatory Visit: Payer: 59 | Admitting: Obstetrics & Gynecology

## 2017-12-08 ENCOUNTER — Encounter: Payer: Self-pay | Admitting: Obstetrics & Gynecology

## 2017-12-08 VITALS — BP 119/77 | HR 105 | Ht 63.0 in | Wt 177.0 lb

## 2017-12-08 DIAGNOSIS — Z9889 Other specified postprocedural states: Secondary | ICD-10-CM

## 2017-12-08 NOTE — Progress Notes (Signed)
   Subjective:    Patient ID: Linda Wiggins, female    DOB: 1985-12-02, 32 y.o.   MRN: [id number]  HPI 32 yo lady here for a post op visit after a laparoscopic application of Filsche clips for permanent sterility. She has no post op problems. She has not had sex since about a month or 2 prior to delivery. She is breastfeeding and has some hot flashes. She has no libido at this point.   Review of Systems     Objective:   Physical Exam Breathing, conversing, and ambulating normally Well nourished, well hydrated White female, no apparent distress Abd- incision healed well     Assessment & Plan:  Post op- doing well Come back 1 year for an annual and pap smear

## 2017-12-17 ENCOUNTER — Telehealth: Payer: Self-pay | Admitting: *Deleted

## 2017-12-17 NOTE — Telephone Encounter (Signed)
Patient wants a referral to a Neurologist. She has since changed her mind since her last office visit on 12/08/17.

## 2017-12-24 ENCOUNTER — Telehealth: Payer: Self-pay | Admitting: *Deleted

## 2017-12-24 NOTE — Telephone Encounter (Signed)
Called patient to update her about seeing a Neurologist per her request. There is no referral in the system but Dr. Marice Potter asked for her to be referred to [facility] [phone]. Left a voicemail with information and contact information for her to schedule per her insurance benefits without a referral. Pt has [employer] and Medicaid.

## 2019-01-07 ENCOUNTER — Other Ambulatory Visit: Payer: Self-pay | Admitting: *Deleted

## 2019-01-17 ENCOUNTER — Other Ambulatory Visit: Payer: Self-pay | Admitting: *Deleted

## 2019-01-17 ENCOUNTER — Other Ambulatory Visit: Payer: Self-pay

## 2019-01-17 ENCOUNTER — Telehealth (HOSPITAL_COMMUNITY): Payer: Self-pay | Admitting: Rehabilitation

## 2019-01-17 NOTE — Telephone Encounter (Signed)
The above patient or their representative was contacted and gave the following answers to these questions:         Do you have any of the following symptoms? No Fever                    Cough                   Shortness of breath  Do  you have any of the following other symptoms? No  muscle pain         vomiting,        diarrhea        rash         weakness        red eye        abdominal pain         bruising         bleeding              joint pain           severe headache  Have you been in contact with someone who was or has been sick in the past 2 weeks? No Yes                 Unsure                         Unable to assess   Does the person that you were in contact with have any of the following symptoms?  Cough         shortness of breath           muscle pain         vomiting,            diarrhea            rash            weakness           fever            red eye           abdominal pain          bruising  or  bleeding                joint pain                severe headache             Have you  or someone you have been in contact with traveled internationally in the last month?  No      If yes, which countries?  Have you  or someone you have been in contact with traveled outside [place] in the last month?  No      If yes, which state and city?  COMMENTS OR ACTION PLAN FOR THIS PATIENT:    

## 2019-01-18 ENCOUNTER — Encounter: Payer: Self-pay | Admitting: Vascular Surgery

## 2019-01-18 ENCOUNTER — Other Ambulatory Visit: Payer: Self-pay

## 2019-01-18 ENCOUNTER — Ambulatory Visit (INDEPENDENT_AMBULATORY_CARE_PROVIDER_SITE_OTHER): Payer: 59 | Admitting: Vascular Surgery

## 2019-01-18 VITALS — BP 105/72 | HR 89 | Temp 99.2°F | Resp 20 | Ht 63.0 in | Wt 184.0 lb

## 2019-01-18 DIAGNOSIS — M5137 Other intervertebral disc degeneration, lumbosacral region: Secondary | ICD-10-CM | POA: Diagnosis not present

## 2019-01-18 NOTE — Progress Notes (Signed)
[facility]  Patient name: Linda Wiggins Neitzke MRN: [id number] DOB: 01/30/1986 Sex: female  REASON FOR CONSULT: Discuss anterior approach for L5-S1 lumbar fusion with Dr. Yevette Edwardsumonski  HPI: Linda Wiggins Karpowicz is a 33 y.o. female, who is here today for discussion of anterior exposure.  She has had progressive low back pain for years and then was having left leg pain and is now having bilateral leg pain.  She has been seen by Dr. Yevette Edwardsumonski was recommended anterior exposure for L5-S1 fusion.  She is here for discussion of my role in her surgery.  She has had prior bilateral inguinal hernia repairs as an infant.  Also had tubal ligation through a laparoscopic approach through her umbilicus.  No other intra-abdominal surgery.  Past Medical History:  Diagnosis Date  . ADHD   . Anemia   . Anxiety   . Chlamydia   . Hearing loss    75% hearing loss in left ear, no hearing aids  . Left leg pain   . Low back pain   . Migraines    otc med prn  . Numbness    Left leg  . SVD (spontaneous vaginal delivery)    x 3  . Vaginal Pap smear, abnormal     Family History  Problem Relation Age of Onset  . Cervical cancer Mother   . Heart murmur Father   . Skin cancer Maternal Grandmother   . Skin cancer Maternal Grandfather   . Skin cancer Paternal Grandmother   . Skin cancer Paternal Grandfather   . Heart disease Paternal Grandfather     SOCIAL HISTORY: Social History   Socioeconomic History  . Marital status: Married    Spouse name: Not on file  . Number of children: Not on file  . Years of education: Not on file  . Highest education level: Not on file  Occupational History  . Not on file  Social Needs  . Financial resource strain: Not on file  . Food insecurity    Worry: Not on file    Inability: Not on file  . Transportation needs    Medical: Not on file    Non-medical: Not on file  Tobacco Use  . Smoking status: Former Smoker     Packs/day: 0.25    Years: 12.00    Pack years: 3.00    Types: Cigarettes    Quit date: 08/26/2017    Years since quitting: 1.3  . Smokeless tobacco: Never Used  Substance and Sexual Activity  . Alcohol use: Yes    Comment: social wine  . Drug use: No  . Sexual activity: Yes    Partners: Male    Birth control/protection: Condom  Lifestyle  . Physical activity    Days per week: Not on file    Minutes per session: Not on file  . Stress: Not on file  Relationships  . Social Musicianconnections    Talks on phone: Not on file    Gets together: Not on file    Attends religious service: Not on file    Active member of club or organization: Not on file    Attends meetings of clubs or organizations: Not on file    Relationship status: Not on file  . Intimate partner violence    Fear of current or ex partner: Not on file    Emotionally abused: Not on file    Physically abused: Not on file    Forced sexual activity: Not on  file  Other Topics Concern  . Not on file  Social History Narrative  . Not on file    Allergies  Allergen Reactions  . Tramadol     "I become paralyzed"    Current Outpatient Medications  Medication Sig Dispense Refill  . ibuprofen (ADVIL,MOTRIN) 600 MG tablet Take 1 tablet (600 mg total) by mouth every 6 (six) hours. 30 tablet 0  . meloxicam (MOBIC) 15 MG tablet TK 1 T PO QD PRF INFLAMMATION    . methocarbamol (ROBAXIN) 500 MG tablet     . Multiple Vitamin (MULTI-VITAMIN DAILY PO) Take 4 tablets by mouth at bedtime. 2 tablets of juice plus fruits 2 tablets of juice plus vegetables    . acetaminophen-codeine (TYLENOL #3) 300-30 MG tablet TK 1 TO 2 TS PO Q 6 TO 8 H PRF PAIN    . Amphetamine-Dextroamphetamine (ADDERALL PO) Take 30 mg by mouth.    Marland Kitchen. FLUoxetine (PROZAC) 20 MG capsule Take 20 mg by mouth at bedtime.   5   No current facility-administered medications for this visit.     REVIEW OF SYSTEMS:  [X]  denotes positive finding, [ ]  denotes negative  finding Cardiac  Comments:  Chest pain or chest pressure:    Shortness of breath upon exertion:    Short of breath when lying flat:    Irregular heart rhythm:        Vascular    Pain in calf, thigh, or hip brought on by ambulation: x   Pain in feet at night that wakes you up from your sleep:     Blood clot in your veins:    Leg swelling:         Pulmonary    Oxygen at home:    Productive cough:     Wheezing:         Neurologic    Sudden weakness in arms or legs:  x   Sudden numbness in arms or legs:  x   Sudden onset of difficulty speaking or slurred speech:    Temporary loss of vision in one eye:     Problems with dizziness:         Gastrointestinal    Blood in stool:     Vomited blood:         Genitourinary    Burning when urinating:     Blood in urine:        Psychiatric    Major depression:         Hematologic    Bleeding problems:    Problems with blood clotting too easily:        Skin    Rashes or ulcers:        Constitutional    Fever or chills:      PHYSICAL EXAM: Vitals:   01/18/19 1518  BP: 105/72  Pulse: 89  Resp: 20  Temp: 99.2 F (37.3 C)  SpO2: 99%  Weight: 83.5 kg  Height: 5\' 3"  (1.6 m)    GENERAL: The patient is a well-nourished female, in no acute distress. The vital signs are documented above. CARDIOVASCULAR: 2+ radial and 2+ dorsalis pedis pulses bilaterally PULMONARY: There is good air exchange  ABDOMEN: Soft and non-tender  MUSCULOSKELETAL: There are no major deformities or cyanosis. NEUROLOGIC: No focal weakness or paresthesias are detected. SKIN: There are no ulcers or rashes noted. PSYCHIATRIC: The patient has a normal affect.  DATA:  Plain films of her back were reviewed showing no calcification in her  aortoiliac segments  MEDICAL ISSUES: Had long discussion with the patient regarding my role in anterior exposure.  Explained to Dr. Lynann Bologna has recommended surgery for correction of her pain and has recommended anterior  approach.  Explained left lower quadrant incision with mobilization of the rectus muscle and retroperitoneal exposure.  Explained mobilization of the ureter and intra-abdominal contents.  Also discussed mobilization of the arterial and venous structures overlying the spine.  Discussed potential of injury to all of these particularly the potential role for major venous injury.  Patient understands and is ready to proceed.  I do not see any contraindications to this.   Rosetta Posner, MD FACS Vascular and Vein Specialists of [facility] Tel [phone] Pager [phone]

## 2019-01-19 ENCOUNTER — Other Ambulatory Visit: Payer: Self-pay | Admitting: Orthopedic Surgery

## 2019-01-26 NOTE — Pre-Procedure Instructions (Signed)
Linda Wiggins  01/26/2019      [id number] - [address], [address] [place] [address] Phone: [phone] Fax: [phone]    Your procedure is scheduled on Mon., January 31, 2019 from 7:28AM-2:31PM  Report to [facility] Entrance "A" at 5:30AM  Call this number if you have problems the morning of surgery:  [phone]   Remember:  Do not eat after midnight on June 28th  You may drink clear liquids until 3 hours (4:30AM) prior to surgery time.  Clear liquids allowed are: Water, Juice (non-citric and without pulp), Carbonated beverages, Clear Tea, Black Coffee only, Plain Jell-O only, Gatorade and Plain Popsicles only   Please complete your PRE-SURGERY ENSURE that was provided to you 3 hours (4:30AM) prior to you surgery start time.  Please, if able, drink it in one setting. DO NOT SIP.     Take these medicines the morning of surgery with A SIP OF WATER: If needed: Acetaminophen-codeine (TYLENOL #3) and Methocarbamol (ROBAXIN)  As of today, stop taking all Other Aspirin Products, Vitamins, Fish oils, and Herbal medications. Also stop all NSAIDS i.e. Advil, Ibuprofen, Motrin, Aleve, Anaprox, Naproxen, BC, Goody Powders, and all Supplements. Including: Meloxicam [facility])  Special instructions:    [place]- Preparing For Surgery  Before surgery, you can play an important role. Because skin is not sterile, your skin needs to be as free of germs as possible. You can reduce the number of germs on your skin by washing with CHG (chlorahexidine gluconate) Soap before surgery.  CHG is an antiseptic cleaner which kills germs and bonds with the skin to continue killing germs even after washing.     Please do not use if you have an allergy to CHG or antibacterial soaps. If your skin becomes reddened/irritated stop using the CHG.  Do not shave (including legs and underarms)  for at least 48 hours prior to first CHG shower. It is OK to shave your face.  Please follow these instructions carefully.   1. Shower the NIGHT BEFORE SURGERY and the MORNING OF SURGERY with CHG.   2. If you chose to wash your hair, wash your hair first as usual with your normal shampoo.  3. After you shampoo, rinse your hair and body thoroughly to remove the shampoo.  4. Use CHG as you would any other liquid soap. You can apply CHG directly to the skin and wash gently with a scrungie or a clean washcloth.   5. Apply the CHG Soap to your body ONLY FROM THE NECK DOWN.  Do not use on open wounds or open sores. Avoid contact with your eyes, ears, mouth and genitals (private parts). Wash Face and genitals (private parts)  with your normal soap.  6. Wash thoroughly, paying special attention to the area where your surgery will be performed.  7. Thoroughly rinse your body with warm water from the neck down.  8. DO NOT shower/wash with your normal soap after using and rinsing off the CHG Soap.  9. Pat yourself dry with a CLEAN TOWEL.  10. Wear CLEAN PAJAMAS to bed the night before surgery, wear comfortable clothes the morning of surgery  11. Place CLEAN SHEETS on your bed the night of your first shower and DO NOT SLEEP WITH PETS.   Day of Surgery:  Oral Hygiene is also important to reduce your risk of infection.  Remember - BRUSH YOUR TEETH  THE MORNING OF SURGERY WITH YOUR REGULAR TOOTHPASTE    Do not wear jewelry, make-up or nail polish.  Do not wear lotions, powders, or perfumes, or deodorant.  Do not shave 48 hours prior to surgery.    Do not bring valuables to the hospital.  [facility] is not responsible for any belongings or valuables.  Contacts, dentures or bridgework may not be worn into surgery.   For patients admitted to the hospital, discharge time will be determined by your treatment team.  Patients discharged the day of surgery will not be allowed to drive home.   Please wear clean clothes to the hospital/surgery center.     Please read over the following fact sheets that you were given. Pain Booklet, Coughing and Deep Breathing, MRSA Information and Surgical Site Infection Prevention

## 2019-01-27 ENCOUNTER — Other Ambulatory Visit (HOSPITAL_COMMUNITY)
Admission: RE | Admit: 2019-01-27 | Discharge: 2019-01-27 | Disposition: A | Discharge status: Home / Self Care | Payer: 59 | Source: Ambulatory Visit | Attending: Orthopedic Surgery | Admitting: Orthopedic Surgery

## 2019-01-27 ENCOUNTER — Encounter (HOSPITAL_COMMUNITY): Payer: Self-pay

## 2019-01-27 ENCOUNTER — Other Ambulatory Visit: Payer: Self-pay

## 2019-01-27 ENCOUNTER — Encounter (HOSPITAL_COMMUNITY)
Admission: RE | Admit: 2019-01-27 | Discharge: 2019-01-27 | Disposition: A | Discharge status: Home / Self Care | Payer: 59 | Source: Ambulatory Visit | Attending: Orthopedic Surgery | Admitting: Orthopedic Surgery

## 2019-01-27 DIAGNOSIS — Z01812 Encounter for preprocedural laboratory examination: Secondary | ICD-10-CM | POA: Insufficient documentation

## 2019-01-27 DIAGNOSIS — Z1159 Encounter for screening for other viral diseases: Secondary | ICD-10-CM | POA: Diagnosis not present

## 2019-01-27 HISTORY — DX: Depression, unspecified: F32.A

## 2019-01-27 LAB — CBC WITH DIFFERENTIAL/PLATELET
Abs Immature Granulocytes: 0.04 10*3/uL (ref 0.00–0.07)
Basophils Absolute: 0 10*3/uL (ref 0.0–0.1)
Basophils Relative: 0 %
Eosinophils Absolute: 0.1 10*3/uL (ref 0.0–0.5)
Eosinophils Relative: 1 %
HCT: 39.5 % (ref 36.0–46.0)
Hemoglobin: 13.5 g/dL (ref 12.0–15.0)
Immature Granulocytes: 0 %
Lymphocytes Relative: 15 %
Lymphs Abs: 1.8 10*3/uL (ref 0.7–4.0)
MCH: 31.7 pg (ref 26.0–34.0)
MCHC: 34.2 g/dL (ref 30.0–36.0)
MCV: 92.7 fL (ref 80.0–100.0)
Monocytes Absolute: 0.7 10*3/uL (ref 0.1–1.0)
Monocytes Relative: 6 %
Neutro Abs: 9.6 10*3/uL — ABNORMAL HIGH (ref 1.7–7.7)
Neutrophils Relative %: 78 %
Platelets: 256 10*3/uL (ref 150–400)
RBC: 4.26 MIL/uL (ref 3.87–5.11)
RDW: 12.1 % (ref 11.5–15.5)
WBC: 12.2 10*3/uL — ABNORMAL HIGH (ref 4.0–10.5)
nRBC: 0 % (ref 0.0–0.2)

## 2019-01-27 LAB — COMPREHENSIVE METABOLIC PANEL
ALT: 18 U/L (ref 0–44)
AST: 19 U/L (ref 15–41)
Albumin: 4.3 g/dL (ref 3.5–5.0)
Alkaline Phosphatase: 84 U/L (ref 38–126)
Anion gap: 10 (ref 5–15)
BUN: 6 mg/dL (ref 6–20)
CO2: 24 mmol/L (ref 22–32)
Calcium: 9.2 mg/dL (ref 8.9–10.3)
Chloride: 103 mmol/L (ref 98–111)
Creatinine, Ser: 0.62 mg/dL (ref 0.44–1.00)
GFR calc Af Amer: >60 mL/min (ref >60)
GFR calc non Af Amer: >60 mL/min (ref >60)
Glucose, Bld: 94 mg/dL (ref 70–99)
Potassium: 3.5 mmol/L (ref 3.5–5.1)
Sodium: 137 mmol/L (ref 135–145)
Total Bilirubin: 0.8 mg/dL (ref 0.3–1.2)
Total Protein: 7.4 g/dL (ref 6.5–8.1)

## 2019-01-27 LAB — URINALYSIS, ROUTINE W REFLEX MICROSCOPIC
Bilirubin Urine: NEGATIVE
Glucose, UA: NEGATIVE mg/dL
Hgb urine dipstick: NEGATIVE
Ketones, ur: NEGATIVE mg/dL
Leukocytes,Ua: NEGATIVE
Nitrite: NEGATIVE
Protein, ur: NEGATIVE mg/dL
Specific Gravity, Urine: 1.011 (ref 1.005–1.030)
pH: 6 (ref 5.0–8.0)

## 2019-01-27 LAB — TYPE AND SCREEN
ABO/RH(D): B POS
Antibody Screen: NEGATIVE

## 2019-01-27 LAB — APTT: aPTT: 32 seconds (ref 24–36)

## 2019-01-27 LAB — SURGICAL PCR SCREEN
MRSA, PCR: NEGATIVE
Staphylococcus aureus: POSITIVE — AB

## 2019-01-27 LAB — PROTIME-INR
INR: 1 (ref 0.8–1.2)
Prothrombin Time: 13.3 seconds (ref 11.4–15.2)

## 2019-01-27 LAB — SARS CORONAVIRUS 2 (TAT 6-24 HRS): SARS Coronavirus 2: NEGATIVE

## 2019-01-27 LAB — ABO/RH: ABO/RH(D): B POS

## 2019-01-27 NOTE — Progress Notes (Signed)
PCP - Scherrie Gerlach, [facility]  Anesthesia review: N/A  Patient denies shortness of breath, fever, cough and chest pain at PAT appointment   Patient verbalized understanding of instructions that were given to them at the PAT appointment. Patient was also instructed that they will need to review over the PAT instructions again at home before surgery.

## 2019-01-30 ENCOUNTER — Encounter (HOSPITAL_COMMUNITY): Payer: Self-pay | Admitting: Anesthesiology

## 2019-01-30 NOTE — Anesthesia Preprocedure Evaluation (Addendum)
Anesthesia Evaluation  Patient identified by MRN, date of birth, ID band Patient awake    Reviewed: Allergy & Precautions, NPO status , Patient's Chart, lab work & pertinent test results  Airway Mallampati: II  TM Distance: >3 FB Neck ROM: Full    Dental  (+) Teeth Intact, Dental Advisory Given   Pulmonary former smoker,    breath sounds clear to auscultation       Cardiovascular negative cardio ROS   Rhythm:Regular Rate:Normal     Neuro/Psych  Headaches, Anxiety Depression    GI/Hepatic negative GI ROS, Neg liver ROS,   Endo/Other  negative endocrine ROS  Renal/GU negative Renal ROS     Musculoskeletal negative musculoskeletal ROS (+)   Abdominal (+) + obese,   Peds  Hematology   Anesthesia Other Findings   Reproductive/Obstetrics                            Anesthesia Physical Anesthesia Plan  ASA: II  Anesthesia Plan: General   Post-op Pain Management:    Induction: Intravenous  PONV Risk Score and Plan: 4 or greater and Ondansetron, Dexamethasone, Midazolam and Scopolamine patch - Pre-op  Airway Management Planned: Oral ETT  Additional Equipment: None  Intra-op Plan:   Post-operative Plan: Extubation in OR  Informed Consent: I have reviewed the patients History and Physical, chart, labs and discussed the procedure including the risks, benefits and alternatives for the proposed anesthesia with the patient or authorized representative who has indicated his/her understanding and acceptance.     Dental advisory given  Plan Discussed with: CRNA  Anesthesia Plan Comments: (2 IV's, Ketamine 0.5mg /kg, possible Lidocaine infusion  COVID-19 Labs  No results for input(s): DDIMER, FERRITIN, LDH, CRP in the last 72 hours.  Lab Results      Component                Value               Date                      [id number]              NEGATIVE            01/27/2019             )      Anesthesia Quick Evaluation

## 2019-01-31 ENCOUNTER — Encounter (HOSPITAL_COMMUNITY): Payer: Self-pay

## 2019-01-31 ENCOUNTER — Inpatient Hospital Stay (HOSPITAL_COMMUNITY): Payer: 59 | Admitting: Certified Registered Nurse Anesthetist

## 2019-01-31 ENCOUNTER — Inpatient Hospital Stay (HOSPITAL_COMMUNITY)
Admission: RE | Admit: 2019-01-31 | Discharge: 2019-02-01 | DRG: 455 | Disposition: A | Discharge status: Home / Self Care | Payer: 59 | Attending: Orthopedic Surgery | Admitting: Orthopedic Surgery

## 2019-01-31 ENCOUNTER — Inpatient Hospital Stay (HOSPITAL_COMMUNITY)
Admission: RE | Disposition: A | Discharge status: Home / Self Care | Payer: Self-pay | Source: Home / Self Care | Attending: Orthopedic Surgery

## 2019-01-31 ENCOUNTER — Other Ambulatory Visit: Payer: Self-pay

## 2019-01-31 ENCOUNTER — Inpatient Hospital Stay (HOSPITAL_COMMUNITY): Payer: 59

## 2019-01-31 ENCOUNTER — Inpatient Hospital Stay (HOSPITAL_COMMUNITY): Payer: 59 | Admitting: Vascular Surgery

## 2019-01-31 DIAGNOSIS — H9192 Unspecified hearing loss, left ear: Secondary | ICD-10-CM | POA: Diagnosis present

## 2019-01-31 DIAGNOSIS — F909 Attention-deficit hyperactivity disorder, unspecified type: Secondary | ICD-10-CM | POA: Diagnosis present

## 2019-01-31 DIAGNOSIS — M5117 Intervertebral disc disorders with radiculopathy, lumbosacral region: Secondary | ICD-10-CM | POA: Diagnosis present

## 2019-01-31 DIAGNOSIS — Z87891 Personal history of nicotine dependence: Secondary | ICD-10-CM

## 2019-01-31 DIAGNOSIS — F419 Anxiety disorder, unspecified: Secondary | ICD-10-CM | POA: Diagnosis present

## 2019-01-31 DIAGNOSIS — M4310 Spondylolisthesis, site unspecified: Secondary | ICD-10-CM | POA: Diagnosis present

## 2019-01-31 DIAGNOSIS — Z885 Allergy status to narcotic agent status: Secondary | ICD-10-CM

## 2019-01-31 DIAGNOSIS — Z8049 Family history of malignant neoplasm of other genital organs: Secondary | ICD-10-CM | POA: Diagnosis not present

## 2019-01-31 DIAGNOSIS — F329 Major depressive disorder, single episode, unspecified: Secondary | ICD-10-CM | POA: Diagnosis present

## 2019-01-31 DIAGNOSIS — M47897 Other spondylosis, lumbosacral region: Secondary | ICD-10-CM | POA: Diagnosis present

## 2019-01-31 DIAGNOSIS — Z79899 Other long term (current) drug therapy: Secondary | ICD-10-CM

## 2019-01-31 DIAGNOSIS — Z8249 Family history of ischemic heart disease and other diseases of the circulatory system: Secondary | ICD-10-CM

## 2019-01-31 DIAGNOSIS — Z808 Family history of malignant neoplasm of other organs or systems: Secondary | ICD-10-CM | POA: Diagnosis not present

## 2019-01-31 DIAGNOSIS — M5116 Intervertebral disc disorders with radiculopathy, lumbar region: Secondary | ICD-10-CM | POA: Diagnosis present

## 2019-01-31 DIAGNOSIS — Z419 Encounter for procedure for purposes other than remedying health state, unspecified: Secondary | ICD-10-CM

## 2019-01-31 DIAGNOSIS — Z791 Long term (current) use of non-steroidal anti-inflammatories (NSAID): Secondary | ICD-10-CM

## 2019-01-31 DIAGNOSIS — M5417 Radiculopathy, lumbosacral region: Secondary | ICD-10-CM | POA: Diagnosis not present

## 2019-01-31 DIAGNOSIS — M5410 Radiculopathy, site unspecified: Secondary | ICD-10-CM | POA: Diagnosis present

## 2019-01-31 HISTORY — PX: ANTERIOR LUMBAR FUSION: SHX1170

## 2019-01-31 HISTORY — PX: ABDOMINAL EXPOSURE: SHX5708

## 2019-01-31 SURGERY — ANTERIOR LUMBAR FUSION 1 LEVEL
Anesthesia: General | Site: Spine Lumbar

## 2019-01-31 MED ORDER — ONDANSETRON HCL 4 MG/2ML IJ SOLN
INTRAMUSCULAR | Status: AC
  Filled 2019-01-31: qty 2

## 2019-01-31 MED ORDER — ROCURONIUM BROMIDE 100 MG/10ML IV SOLN
INTRAVENOUS | Status: DC | PRN
  Administered 2019-01-31: 50 mg via INTRAVENOUS
  Administered 2019-01-31: 10 mg via INTRAVENOUS
  Administered 2019-01-31 (×2): 20 mg via INTRAVENOUS

## 2019-01-31 MED ORDER — BUPIVACAINE-EPINEPHRINE (PF) 0.25% -1:200000 IJ SOLN
INTRAMUSCULAR | Status: AC
  Filled 2019-01-31: qty 30

## 2019-01-31 MED ORDER — OXYCODONE-ACETAMINOPHEN 5-325 MG PO TABS
1.0000 | ORAL_TABLET | ORAL | Status: DC | PRN
  Administered 2019-01-31 – 2019-02-01 (×6): 2 via ORAL
  Filled 2019-01-31 (×6): qty 2

## 2019-01-31 MED ORDER — ACETAMINOPHEN 10 MG/ML IV SOLN: 1000.0000 mg | Freq: Once | INTRAVENOUS | Status: DC | PRN

## 2019-01-31 MED ORDER — BUPIVACAINE-EPINEPHRINE 0.25% -1:200000 IJ SOLN
INTRAMUSCULAR | Status: DC | PRN
  Administered 2019-01-31 (×2): 10 mL

## 2019-01-31 MED ORDER — DOCUSATE SODIUM 100 MG PO CAPS
100.0000 mg | ORAL_CAPSULE | Freq: Two times a day (BID) | ORAL | Status: DC
  Administered 2019-01-31 – 2019-02-01 (×3): 100 mg via ORAL
  Filled 2019-01-31 (×3): qty 1

## 2019-01-31 MED ORDER — LACTATED RINGERS IV SOLN
INTRAVENOUS | Status: DC | PRN
  Administered 2019-01-31: 08:00:00 via INTRAVENOUS

## 2019-01-31 MED ORDER — LIDOCAINE 2% (20 MG/ML) 5 ML SYRINGE
INTRAMUSCULAR | Status: AC
  Filled 2019-01-31: qty 5

## 2019-01-31 MED ORDER — DIAZEPAM 5 MG PO TABS
ORAL_TABLET | ORAL | Status: AC
  Filled 2019-01-31: qty 1

## 2019-01-31 MED ORDER — KETAMINE HCL 10 MG/ML IJ SOLN
INTRAMUSCULAR | Status: DC | PRN
  Administered 2019-01-31: 40 mg via INTRAVENOUS
  Administered 2019-01-31: 10 mg via INTRAVENOUS

## 2019-01-31 MED ORDER — HEMOSTATIC AGENTS (NO CHARGE) OPTIME
TOPICAL | Status: DC | PRN
  Administered 2019-01-31: 1 via TOPICAL

## 2019-01-31 MED ORDER — SUGAMMADEX SODIUM 200 MG/2ML IV SOLN
INTRAVENOUS | Status: DC | PRN
  Administered 2019-01-31: 180 mg via INTRAVENOUS

## 2019-01-31 MED ORDER — LACTATED RINGERS IV SOLN
INTRAVENOUS | Status: DC | PRN
  Administered 2019-01-31 (×2): via INTRAVENOUS

## 2019-01-31 MED ORDER — PROPOFOL 10 MG/ML IV BOLUS
INTRAVENOUS | Status: AC
  Filled 2019-01-31: qty 40

## 2019-01-31 MED ORDER — ACETAMINOPHEN 325 MG PO TABS: 650.0000 mg | ORAL_TABLET | ORAL | Status: DC | PRN

## 2019-01-31 MED ORDER — ONDANSETRON HCL 4 MG/2ML IJ SOLN
INTRAMUSCULAR | Status: DC | PRN
  Administered 2019-01-31: 4 mg via INTRAVENOUS

## 2019-01-31 MED ORDER — CEFAZOLIN SODIUM-DEXTROSE 2-4 GM/100ML-% IV SOLN
INTRAVENOUS | Status: AC
  Filled 2019-01-31: qty 100

## 2019-01-31 MED ORDER — ROCURONIUM BROMIDE 10 MG/ML (PF) SYRINGE
PREFILLED_SYRINGE | INTRAVENOUS | Status: AC
  Filled 2019-01-31: qty 10

## 2019-01-31 MED ORDER — THROMBIN (RECOMBINANT) 20000 UNITS EX SOLR
CUTANEOUS | Status: AC
  Filled 2019-01-31: qty 20000

## 2019-01-31 MED ORDER — BUPIVACAINE LIPOSOME 1.3 % IJ SUSP
20.0000 mL | Freq: Once | INTRAMUSCULAR | Status: DC
  Filled 2019-01-31: qty 20

## 2019-01-31 MED ORDER — POTASSIUM CHLORIDE IN NACL 20-0.9 MEQ/L-% IV SOLN: INTRAVENOUS | Status: DC

## 2019-01-31 MED ORDER — ALUM & MAG HYDROXIDE-SIMETH 200-200-20 MG/5ML PO SUSP: 30.0000 mL | Freq: Four times a day (QID) | ORAL | Status: DC | PRN

## 2019-01-31 MED ORDER — THROMBIN 20000 UNITS EX SOLR
CUTANEOUS | Status: DC | PRN
  Administered 2019-01-31: 20000 [IU] via TOPICAL

## 2019-01-31 MED ORDER — BUPIVACAINE LIPOSOME 1.3 % IJ SUSP
INTRAMUSCULAR | Status: DC | PRN
  Administered 2019-01-31: 20 mL

## 2019-01-31 MED ORDER — POVIDONE-IODINE 7.5 % EX SOLN
Freq: Once | CUTANEOUS | Status: DC
  Filled 2019-01-31: qty 118

## 2019-01-31 MED ORDER — ONDANSETRON HCL 4 MG PO TABS: 4.0000 mg | ORAL_TABLET | Freq: Four times a day (QID) | ORAL | Status: DC | PRN

## 2019-01-31 MED ORDER — PHENOL 1.4 % MT LIQD: 1.0000 | OROMUCOSAL | Status: DC | PRN

## 2019-01-31 MED ORDER — FENTANYL CITRATE (PF) 250 MCG/5ML IJ SOLN
INTRAMUSCULAR | Status: AC
  Filled 2019-01-31: qty 5

## 2019-01-31 MED ORDER — MIDAZOLAM HCL 2 MG/2ML IJ SOLN
INTRAMUSCULAR | Status: AC
  Filled 2019-01-31: qty 2

## 2019-01-31 MED ORDER — VITAMIN D 25 MCG (1000 UNIT) PO TABS
2000.0000 [IU] | ORAL_TABLET | Freq: Every day | ORAL | Status: DC
  Administered 2019-01-31 – 2019-02-01 (×2): 2000 [IU] via ORAL
  Filled 2019-01-31 (×2): qty 2

## 2019-01-31 MED ORDER — SCOPOLAMINE 1 MG/3DAYS TD PT72
MEDICATED_PATCH | TRANSDERMAL | Status: AC
  Filled 2019-01-31: qty 1

## 2019-01-31 MED ORDER — CEFAZOLIN SODIUM-DEXTROSE 2-4 GM/100ML-% IV SOLN
2.0000 g | INTRAVENOUS | Status: AC
  Administered 2019-01-31 (×2): 2 g via INTRAVENOUS

## 2019-01-31 MED ORDER — HYDROMORPHONE HCL 1 MG/ML IJ SOLN
INTRAMUSCULAR | Status: AC
  Filled 2019-01-31: qty 1

## 2019-01-31 MED ORDER — FENTANYL CITRATE (PF) 100 MCG/2ML IJ SOLN
INTRAMUSCULAR | Status: DC | PRN
  Administered 2019-01-31 (×3): 50 ug via INTRAVENOUS
  Administered 2019-01-31: 100 ug via INTRAVENOUS
  Administered 2019-01-31 (×4): 50 ug via INTRAVENOUS

## 2019-01-31 MED ORDER — SODIUM CHLORIDE 0.9 % IV SOLN
INTRAVENOUS | Status: DC | PRN
  Administered 2019-01-31: 20 ug/min via INTRAVENOUS

## 2019-01-31 MED ORDER — MEPERIDINE HCL 25 MG/ML IJ SOLN: 6.2500 mg | INTRAMUSCULAR | Status: DC | PRN

## 2019-01-31 MED ORDER — ACETAMINOPHEN 325 MG PO TABS: 325.0000 mg | ORAL_TABLET | Freq: Once | ORAL | Status: DC | PRN

## 2019-01-31 MED ORDER — SODIUM CHLORIDE 0.9% FLUSH
3.0000 mL | Freq: Two times a day (BID) | INTRAVENOUS | Status: DC
  Administered 2019-01-31: 3 mL via INTRAVENOUS

## 2019-01-31 MED ORDER — DEXAMETHASONE SODIUM PHOSPHATE 10 MG/ML IJ SOLN
INTRAMUSCULAR | Status: AC
  Filled 2019-01-31: qty 1

## 2019-01-31 MED ORDER — DEXAMETHASONE SODIUM PHOSPHATE 10 MG/ML IJ SOLN
INTRAMUSCULAR | Status: DC | PRN
  Administered 2019-01-31: 10 mg via INTRAVENOUS

## 2019-01-31 MED ORDER — MIDAZOLAM HCL 5 MG/5ML IJ SOLN
INTRAMUSCULAR | Status: DC | PRN
  Administered 2019-01-31: 2 mg via INTRAVENOUS

## 2019-01-31 MED ORDER — LIDOCAINE IN D5W 4-5 MG/ML-% IV SOLN
INTRAVENOUS | Status: DC | PRN
  Administered 2019-01-31: 25 ug/kg/min via INTRAVENOUS

## 2019-01-31 MED ORDER — ONDANSETRON HCL 4 MG/2ML IJ SOLN: 4.0000 mg | Freq: Four times a day (QID) | INTRAMUSCULAR | Status: DC | PRN

## 2019-01-31 MED ORDER — ACETAMINOPHEN 160 MG/5ML PO SOLN: 325.0000 mg | Freq: Once | ORAL | Status: DC | PRN

## 2019-01-31 MED ORDER — CHLORHEXIDINE GLUCONATE 4 % EX LIQD: 60.0000 mL | Freq: Once | CUTANEOUS | Status: DC

## 2019-01-31 MED ORDER — BISACODYL 5 MG PO TBEC: 5.0000 mg | DELAYED_RELEASE_TABLET | Freq: Every day | ORAL | Status: DC | PRN

## 2019-01-31 MED ORDER — PROPOFOL 10 MG/ML IV BOLUS
INTRAVENOUS | Status: DC | PRN
  Administered 2019-01-31: 150 mg via INTRAVENOUS

## 2019-01-31 MED ORDER — METHOCARBAMOL 500 MG PO TABS
500.0000 mg | ORAL_TABLET | Freq: Four times a day (QID) | ORAL | Status: DC | PRN
  Administered 2019-01-31 – 2019-02-01 (×2): 500 mg via ORAL
  Filled 2019-01-31 (×2): qty 1

## 2019-01-31 MED ORDER — KETAMINE HCL 50 MG/5ML IJ SOSY
PREFILLED_SYRINGE | INTRAMUSCULAR | Status: AC
  Filled 2019-01-31: qty 10

## 2019-01-31 MED ORDER — METHYLENE BLUE 0.5 % INJ SOLN
INTRAVENOUS | Status: AC
  Filled 2019-01-31: qty 10

## 2019-01-31 MED ORDER — ACETAMINOPHEN 650 MG RE SUPP: 650.0000 mg | RECTAL | Status: DC | PRN

## 2019-01-31 MED ORDER — LIDOCAINE 2% (20 MG/ML) 5 ML SYRINGE
INTRAMUSCULAR | Status: DC | PRN
  Administered 2019-01-31: 60 mg via INTRAVENOUS

## 2019-01-31 MED ORDER — 0.9 % SODIUM CHLORIDE (POUR BTL) OPTIME
TOPICAL | Status: DC | PRN
  Administered 2019-01-31 (×2): 1000 mL

## 2019-01-31 MED ORDER — LACTATED RINGERS IV SOLN: INTRAVENOUS | Status: DC

## 2019-01-31 MED ORDER — SODIUM CHLORIDE 0.9% FLUSH: 3.0000 mL | INTRAVENOUS | Status: DC | PRN

## 2019-01-31 MED ORDER — MUPIROCIN 2 % EX OINT
1.0000 "application " | TOPICAL_OINTMENT | Freq: Two times a day (BID) | CUTANEOUS | Status: DC
  Administered 2019-01-31: 1 via NASAL
  Filled 2019-01-31: qty 22

## 2019-01-31 MED ORDER — ZOLPIDEM TARTRATE 5 MG PO TABS: 5.0000 mg | ORAL_TABLET | Freq: Every evening | ORAL | Status: DC | PRN

## 2019-01-31 MED ORDER — FLEET ENEMA 7-19 GM/118ML RE ENEM: 1.0000 | ENEMA | Freq: Once | RECTAL | Status: DC | PRN

## 2019-01-31 MED ORDER — SCOPOLAMINE 1 MG/3DAYS TD PT72
MEDICATED_PATCH | TRANSDERMAL | Status: DC | PRN
  Administered 2019-01-31: 1 via TRANSDERMAL

## 2019-01-31 MED ORDER — SODIUM CHLORIDE 0.9 % IV SOLN: 250.0000 mL | INTRAVENOUS | Status: DC

## 2019-01-31 MED ORDER — SENNOSIDES-DOCUSATE SODIUM 8.6-50 MG PO TABS: 1.0000 | ORAL_TABLET | Freq: Every evening | ORAL | Status: DC | PRN

## 2019-01-31 MED ORDER — HYDROMORPHONE HCL 1 MG/ML IJ SOLN
0.2500 mg | INTRAMUSCULAR | Status: DC | PRN
  Administered 2019-01-31 (×3): 0.5 mg via INTRAVENOUS

## 2019-01-31 MED ORDER — PHENYLEPHRINE 40 MCG/ML (10ML) SYRINGE FOR IV PUSH (FOR BLOOD PRESSURE SUPPORT)
PREFILLED_SYRINGE | INTRAVENOUS | Status: DC | PRN
  Administered 2019-01-31 (×2): 80 ug via INTRAVENOUS

## 2019-01-31 MED ORDER — CEFAZOLIN SODIUM-DEXTROSE 2-4 GM/100ML-% IV SOLN
2.0000 g | Freq: Three times a day (TID) | INTRAVENOUS | Status: AC
  Administered 2019-01-31 – 2019-02-01 (×2): 2 g via INTRAVENOUS
  Filled 2019-01-31 (×2): qty 100

## 2019-01-31 MED ORDER — MENTHOL 3 MG MT LOZG: 1.0000 | LOZENGE | OROMUCOSAL | Status: DC | PRN

## 2019-01-31 MED ORDER — DIAZEPAM 5 MG PO TABS
5.0000 mg | ORAL_TABLET | Freq: Four times a day (QID) | ORAL | Status: DC | PRN
  Administered 2019-01-31: 12:00:00 5 mg via ORAL

## 2019-01-31 MED ORDER — PROMETHAZINE HCL 25 MG/ML IJ SOLN: 6.2500 mg | INTRAMUSCULAR | Status: DC | PRN

## 2019-01-31 MED ORDER — MORPHINE SULFATE (PF) 2 MG/ML IV SOLN
1.0000 mg | INTRAVENOUS | Status: DC | PRN
  Administered 2019-01-31 – 2019-02-01 (×2): 2 mg via INTRAVENOUS
  Filled 2019-01-31 (×2): qty 1

## 2019-01-31 SURGICAL SUPPLY — 125 items
APPLIER CLIP 11 MED OPEN (CLIP) ×4
BENZOIN TINCTURE PRP APPL 2/3 (GAUZE/BANDAGES/DRESSINGS) ×8 IMPLANT
BLADE SURG 10 STRL SS (BLADE) ×4 IMPLANT
BUR PRESCISION 1.7 ELITE (BURR) ×4 IMPLANT
BUR ROUND PRECISION 4.0 (BURR) ×3 IMPLANT
BUR ROUND PRECISION 4.0MM (BURR) ×1
BUR SABER RD CUTTING 3.0 (BURR) IMPLANT
BUR SABER RD CUTTING 3.0MM (BURR)
CARTRIDGE OIL MAESTRO DRILL (MISCELLANEOUS) ×4 IMPLANT
CLIP APPLIE 11 MED OPEN (CLIP) ×2 IMPLANT
CLOSURE STERI-STRIP 1/2X4 (GAUZE/BANDAGES/DRESSINGS) ×2
CLOSURE WOUND 1/2 X4 (GAUZE/BANDAGES/DRESSINGS) ×2
CLSR STERI-STRIP ANTIMIC 1/2X4 (GAUZE/BANDAGES/DRESSINGS) ×6 IMPLANT
CONT SPEC 4OZ CLIKSEAL STRL BL (MISCELLANEOUS) ×4 IMPLANT
CORD BIPOLAR FORCEPS 12FT (ELECTRODE) ×4 IMPLANT
COVER SURGICAL LIGHT HANDLE (MISCELLANEOUS) ×4 IMPLANT
COVER WAND RF STERILE (DRAPES) ×8 IMPLANT
DERMABOND ADVANCED (GAUZE/BANDAGES/DRESSINGS) ×2
DERMABOND ADVANCED .7 DNX12 (GAUZE/BANDAGES/DRESSINGS) ×2 IMPLANT
DIFFUSER DRILL AIR PNEUMATIC (MISCELLANEOUS) IMPLANT
DRAIN CHANNEL 15F RND FF W/TCR (WOUND CARE) IMPLANT
DRAPE C-ARM 42X72 X-RAY (DRAPES) ×12 IMPLANT
DRAPE C-ARMOR (DRAPES) ×4 IMPLANT
DRAPE POUCH INSTRU U-SHP 10X18 (DRAPES) ×4 IMPLANT
DRAPE SURG 17X23 STRL (DRAPES) ×32 IMPLANT
DRSG MEPILEX BORDER 4X12 (GAUZE/BANDAGES/DRESSINGS) ×4 IMPLANT
DRSG MEPILEX BORDER 4X8 (GAUZE/BANDAGES/DRESSINGS) ×8 IMPLANT
DURAPREP 26ML APPLICATOR (WOUND CARE) ×8 IMPLANT
ELECT BLADE 4.0 EZ CLEAN MEGAD (MISCELLANEOUS) ×8
ELECT CAUTERY BLADE 6.4 (BLADE) ×8 IMPLANT
ELECT REM PT RETURN 9FT ADLT (ELECTROSURGICAL) ×4
ELECTRODE BLDE 4.0 EZ CLN MEGD (MISCELLANEOUS) ×4 IMPLANT
ELECTRODE REM PT RTRN 9FT ADLT (ELECTROSURGICAL) ×2 IMPLANT
EVACUATOR SILICONE 100CC (DRAIN) ×4 IMPLANT
FEE INTRAOP MONITOR IMPULS NCS (MISCELLANEOUS) ×2 IMPLANT
GAUZE 4X4 16PLY RFD (DISPOSABLE) ×8 IMPLANT
GAUZE SPONGE 4X4 12PLY STRL (GAUZE/BANDAGES/DRESSINGS) ×8 IMPLANT
GLOVE BIO SURGEON STRL SZ7 (GLOVE) ×16 IMPLANT
GLOVE BIO SURGEON STRL SZ8 (GLOVE) ×4 IMPLANT
GLOVE BIOGEL PI IND STRL 7.0 (GLOVE) ×2 IMPLANT
GLOVE BIOGEL PI IND STRL 8 (GLOVE) ×4 IMPLANT
GLOVE BIOGEL PI INDICATOR 7.0 (GLOVE) ×2
GLOVE BIOGEL PI INDICATOR 8 (GLOVE) ×4
GLOVE SS BIOGEL STRL SZ 7.5 (GLOVE) ×2 IMPLANT
GLOVE SUPERSENSE BIOGEL SZ 7.5 (GLOVE) ×2
GOWN STRL REUS W/ TWL LRG LVL3 (GOWN DISPOSABLE) ×6 IMPLANT
GOWN STRL REUS W/ TWL XL LVL3 (GOWN DISPOSABLE) ×2 IMPLANT
GOWN STRL REUS W/TWL LRG LVL3 (GOWN DISPOSABLE) ×6
GOWN STRL REUS W/TWL XL LVL3 (GOWN DISPOSABLE) ×2
GUIDEWIRE BLUNT VIPER II 1.45 (WIRE) ×20 IMPLANT
GUIDEWIRE SHARP VIPER II (WIRE) ×16 IMPLANT
HEMOSTAT SURGICEL 2X14 (HEMOSTASIS) IMPLANT
INTRAOP MONITOR FEE IMPULS NCS (MISCELLANEOUS) ×2
INTRAOP MONITOR FEE IMPULSE (MISCELLANEOUS) ×2
IV CATH 14GX2 1/4 (CATHETERS) ×4 IMPLANT
KIT ALARA NEURO ACCESS (KITS) ×8 IMPLANT
KIT BASIN OR (CUSTOM PROCEDURE TRAY) ×4 IMPLANT
KIT POSITION SURG JACKSON T1 (MISCELLANEOUS) ×4 IMPLANT
KIT TURNOVER KIT B (KITS) ×4 IMPLANT
LOOP VESSEL MAXI BLUE (MISCELLANEOUS) IMPLANT
LOOP VESSEL MINI RED (MISCELLANEOUS) IMPLANT
MARKER SKIN DUAL TIP RULER LAB (MISCELLANEOUS) ×8 IMPLANT
MIX DBX 10CC 35% BONE (Bone Implant) ×4 IMPLANT
NEEDLE HYPO 25GX1X1/2 BEV (NEEDLE) ×4 IMPLANT
NEEDLE SPNL 18GX3.5 QUINCKE PK (NEEDLE) ×4 IMPLANT
NS IRRIG 1000ML POUR BTL (IV SOLUTION) ×4 IMPLANT
OIL CARTRIDGE MAESTRO DRILL (MISCELLANEOUS) ×8
PACK LAMINECTOMY ORTHO (CUSTOM PROCEDURE TRAY) ×4 IMPLANT
PACK UNIVERSAL I (CUSTOM PROCEDURE TRAY) ×8 IMPLANT
PAD ARMBOARD 7.5X6 YLW CONV (MISCELLANEOUS) ×16 IMPLANT
PATTIES SURGICAL .5 X1 (DISPOSABLE) ×4 IMPLANT
PATTIES SURGICAL .5 X3 (DISPOSABLE) IMPLANT
PATTIES SURGICAL .5X1.5 (GAUZE/BANDAGES/DRESSINGS) ×4 IMPLANT
PATTIES SURGICAL .75X.75 (GAUZE/BANDAGES/DRESSINGS) ×4 IMPLANT
PENCIL BUTTON BLDE SNGL 10FT (ELECTRODE) ×4 IMPLANT
PROBE PED SCREW MONOP 3 BT NCS (MISCELLANEOUS) ×2 IMPLANT
ROD VIPER2 5.5X45 PRE LARDOSED (Rod) ×8 IMPLANT
SCREW POLY VIPER2 7X40MM (Screw) ×8 IMPLANT
SCREW PROBE PEDICLE MONOPOLAR (MISCELLANEOUS) ×2
SCREW SET SINGLE INNER MIS (Screw) ×16 IMPLANT
SCREW XTAB POLY VIPER  7X45 (Screw) ×4 IMPLANT
SCREW XTAB POLY VIPER 7X45 (Screw) ×4 IMPLANT
SPONGE INTESTINAL PEANUT (DISPOSABLE) ×16 IMPLANT
SPONGE LAP 18X18 RF (DISPOSABLE) IMPLANT
SPONGE LAP 4X18 RFD (DISPOSABLE) IMPLANT
SPONGE SURGIFOAM ABS GEL 100 (HEMOSTASIS) ×4 IMPLANT
STRIP CLOSURE SKIN 1/2X4 (GAUZE/BANDAGES/DRESSINGS) ×6 IMPLANT
SURGIFLO W/THROMBIN 8M KIT (HEMOSTASIS) IMPLANT
SUT MNCRL AB 4-0 PS2 18 (SUTURE) ×12 IMPLANT
SUT PDS AB 1 CTX 36 (SUTURE) ×8 IMPLANT
SUT PROLENE 4 0 RB 1 (SUTURE)
SUT PROLENE 4-0 RB1 .5 CRCL 36 (SUTURE) IMPLANT
SUT PROLENE 5 0 C 1 24 (SUTURE) IMPLANT
SUT PROLENE 5 0 CC1 (SUTURE) IMPLANT
SUT PROLENE 6 0 CC (SUTURE) ×4 IMPLANT
SUT SILK 0 TIES 10X30 (SUTURE) ×4 IMPLANT
SUT SILK 2 0 TIES 10X30 (SUTURE) IMPLANT
SUT SILK 3 0 TIES 10X30 (SUTURE) ×4 IMPLANT
SUT SILK 3 0SH CR/8 30 (SUTURE) IMPLANT
SUT VIC AB 0 CT1 18XCR BRD 8 (SUTURE) ×4 IMPLANT
SUT VIC AB 0 CT1 8-18 (SUTURE) ×4
SUT VIC AB 1 CT1 18XCR BRD 8 (SUTURE) ×4 IMPLANT
SUT VIC AB 1 CT1 27 (SUTURE) ×2
SUT VIC AB 1 CT1 27XBRD ANBCTR (SUTURE) ×2 IMPLANT
SUT VIC AB 1 CT1 8-18 (SUTURE) ×4
SUT VIC AB 1 CTX 36 (SUTURE) ×2
SUT VIC AB 1 CTX36XBRD ANBCTR (SUTURE) ×2 IMPLANT
SUT VIC AB 2-0 CT2 18 VCP726D (SUTURE) ×4 IMPLANT
SYNCAGE EVOL SM 13.5X8.9 14D (Spacer) ×4 IMPLANT
SYR 20CC LL (SYRINGE) ×3 IMPLANT
SYR BULB IRRIGATION 50ML (SYRINGE) ×4 IMPLANT
SYR CONTROL 10ML LL (SYRINGE) ×8 IMPLANT
SYR TB 1ML LUER SLIP (SYRINGE) ×4 IMPLANT
SYRINGE 20CC LL (MISCELLANEOUS) ×4 IMPLANT
TAP CANN VIPER2 DL 6.0 (TAP) ×8 IMPLANT
TAPE CLOTH SURG 4X10 WHT LF (GAUZE/BANDAGES/DRESSINGS) ×4 IMPLANT
TOWEL GREEN STERILE (TOWEL DISPOSABLE) ×4 IMPLANT
TOWEL GREEN STERILE FF (TOWEL DISPOSABLE) ×4 IMPLANT
TOWEL OR 17X26 10 PK STRL BLUE (TOWEL DISPOSABLE) ×4 IMPLANT
TRAY FOLEY CATH 14FR (SET/KITS/TRAYS/PACK) ×4 IMPLANT
TRAY FOLEY MTR SLVR 16FR STAT (SET/KITS/TRAYS/PACK) IMPLANT
TUBE CONNECTING 12'X1/4 (SUCTIONS) ×1
TUBE CONNECTING 12X1/4 (SUCTIONS) ×3 IMPLANT
WATER STERILE IRR 1000ML POUR (IV SOLUTION) ×4 IMPLANT
YANKAUER SUCT BULB TIP NO VENT (SUCTIONS) ×4 IMPLANT

## 2019-01-31 NOTE — Anesthesia Procedure Notes (Signed)
Procedure Name: Intubation Date/Time: 01/31/2019 7:45 AM Performed by: Candis Shine, CRNA Pre-anesthesia Checklist: Patient identified, Emergency Drugs available, Suction available and Patient being monitored Patient Re-evaluated:Patient Re-evaluated prior to induction Oxygen Delivery Method: Circle System Utilized Preoxygenation: Pre-oxygenation with 100% oxygen Induction Type: IV induction Ventilation: Mask ventilation without difficulty Laryngoscope Size: Mac and 3 Grade View: Grade I Tube type: Oral Tube size: 7.0 mm Number of attempts: 1 Airway Equipment and Method: Stylet Placement Confirmation: ETT inserted through vocal cords under direct vision,  positive ETCO2 and breath sounds checked- equal and bilateral Secured at: 22 cm Tube secured with: Tape Dental Injury: Teeth and Oropharynx as per pre-operative assessment

## 2019-01-31 NOTE — Progress Notes (Signed)
Orthopedic Tech Progress Note Patient Details:  Linda Wiggins [birth year] [id number] Patient supplied tlso. Patient ID: Linda Wiggins, female   DOB: 1986/01/20, 33 y.o.   MRN: [id number]   [name] T 01/31/2019, 1:30 PM

## 2019-01-31 NOTE — Transfer of Care (Signed)
Immediate Anesthesia Transfer of Care Note  Patient: Linda Wiggins  Procedure(s) Performed: LUMBAR 5 - SACRUM 1 ANTERIOR LUMBAR INTERBODY FUSION WITH POSTERIOR SPINAL FUSION WITH INSTRUMENTATION AND ALLOGRAFT (N/A ) LUMBAR 5-SACRUM 1 POSTERIOR SPINAL FUSION WITH INSTRUMENTATION AND ALLOGRAFT (N/A Spine Lumbar) ABDOMINAL EXPOSURE (N/A )  Patient Location: PACU  Anesthesia Type:General  Level of Consciousness: awake, alert  and oriented  Airway & Oxygen Therapy: Patient Spontanous Breathing and Patient connected to nasal cannula oxygen  Post-op Assessment: Report given to RN and Post -op Vital signs reviewed and stable  Post vital signs: Reviewed and stable  Last Vitals:  Vitals Value Taken Time  BP 115/66 01/31/19 1200  Temp    Pulse 106 01/31/19 1202  Resp 18 01/31/19 1202  SpO2 100 % 01/31/19 1202  Vitals shown include unvalidated device data.  Last Pain:  Vitals:   01/31/19 0626  TempSrc:   PainSc: 6       Patients Stated Pain Goal: 2 (14/78/29 5621)  Complications: No apparent anesthesia complications

## 2019-01-31 NOTE — Progress Notes (Signed)
Pt speaking on phone with husband updating family.

## 2019-01-31 NOTE — Op Note (Signed)
    OPERATIVE REPORT  DATE OF SURGERY: 01/31/2019  PATIENT: Linda Wiggins, 33 y.o. female MRN: [id number]  DOB: 28-Mar-1986  PRE-OPERATIVE DIAGNOSIS: Degenerative disc disease  POST-OPERATIVE DIAGNOSIS:  Same  PROCEDURE: Anterior exposure for L5-S1 lumbar fusion  SURGEON:  Curt Jews, M.D.  Co-surgeon for the exposure Dr. Phylliss Bob  PHYSICIAN ASSISTANT: Pricilla Holm PA-C  ANESTHESIA: General  EBL: per anesthesia record  Total I/O In: 2000 [I.V.:2000] Out: 750 [Urine:650; Blood:100]  BLOOD ADMINISTERED: none  DRAINS: none  SPECIMEN: none  COUNTS CORRECT:  YES  PATIENT DISPOSITION:  PACU - hemodynamically stable  PROCEDURE DETAILS: Patient was taken operating placed to position where the area of the abdomen was prepped draped in sterile fashion.  Crosstable lateral C arm was used to identify the level of the L 5 S1 disc.  Incision was made from the midline laterally to the left.  The anterior rectus sheath was opened in line with the skin incision and the rectus muscle was mobilized circumferentially.  The retroperitoneal space was entered bluntly lateral to the rectus muscle.  The posterior rectus sheath was opened laterally.  Blunt dissection was used to continue mobilization of the intraperitoneal contents and left ureter.  Dissection was above the level of the rectus muscle.  The section over the L5-S1 disc was continued bluntly.  The middle sacral vessels were clipped and divided.  Blunt dissection with a Kitner dissector was used to continue dissection to the right and left of the L5-S1 disc.  The Thompson retractor was brought onto the field and the reverse lip 150 blades were positioned to the right and left of the L5-S1 disc.  A needle was placed in this disc level and C-arm was brought back onto the field to confirm that this was the appropriate level.  The remainder of the surgery will be dictated as a separate note by Dr. Levora Dredge, M.D.,  [facility] 01/31/2019 4:28 PM

## 2019-01-31 NOTE — Op Note (Signed)
PATIENT NAME: [place] RECORD NO.:   [id number]    PHYSICIAN:  Phylliss Bob, MD      DATE OF BIRTH: Jan 20, 1986   DATE OF PROCEDURE: 01/31/2019                              OPERATIVE REPORT   PREOPERATIVE DIAGNOSES: 1. Axial low back pain. 2. Bilateral lumbar radiculopathy. 3. L5-S1 degenerative disk disease. 4. Bilateral L5 pars defect 5. Grade 2 L5/S1 spondylolisthesis  POSTOPERATIVE DIAGNOSES: 1. Axial low back pain. 2. Bilateral lumbar radiculopathy. 3. L5-S1 degenerative disk disease. 4. Bilateral L5 pars defect 5. Grade 2 L5/S1 spondylolisthesis  PROCEDURE: 1. Anterior lumbar interbody fusion, L5-S1 (expsure peformed by Dr. Sherren Mocha Early) 2. Insertion of interbody device x 1 (Syncage, 13.35mm, small, lordotic) 3. Placement of anterior instrumentation, L5-S1. 4. Intraoperative use of fluoroscopy. 5. Posterior spinal fusion, L5-S1. 6. Placement of posterior instrumentation, L5, S1. 7. Use of morselized allograft-DBX mix.  SURGEON:  Phylliss Bob, MD  ASSISTANT:  Pricilla Holm, PA-C  ANESTHESIA:  General endotracheal anesthesia.  COMPLICATIONS:  None.  DISPOSITION:  Stable.  ESTIMATED BLOOD LOSS:  Minimal  INDICATIONS FOR SURGERY:  Briefly, Ms. Hinchcliff is a very pleasant 33 year old female, who did present to me with severe and debilitating pain in both her back and left leg.  The patient's imaging was notable for The finding noted above. Given the patient's ongoing pain and dysfunction, we did discuss proceeding with the procedure noted above.  The patient was fully aware that surgery may or may not alleviate his ongoing symptoms, particularly his low back pain. The patient did elect to proceed.  OPERATIVE DETAILS:  On 01/31/2019, the patient was brought to surgery and general endotracheal anesthesia was administered.  The patient was placed supine on the hospital bed.  The patient's abdomen was prepped and draped in the usual sterile  fashion.  An anterior retroperitoneal approach was then performed by Dr. Curt Jews.Once the anterior lumbar spine was noted, we did focus our attention on the L5-S1 intervertebral space.  I then performed a thorough and complete L5-S1 intervertebral diskectomy to the level of the posterior longitudinal ligament.  I was very pleased with the diskectomy that I was able to accomplish.  The endplates were then appropriately prepared and the appropriate sized anterior intervertebral spacer was packed with DBX mix and tamped into position. I was very pleased with the press-fit of the implant.  I then proceeded with placement of anterior instrumentation at the L5-S1 intervertebral space. To accomplish this, I did use an awl to prepare the trajectory of anterior vertebral body screw.  A washer was attached to the back end of the screw and did help provide anterior fixation across the L5-S1 intervertebral space.  I was very pleased with the press-fit of the anterior hardware and the final AP and lateral fluoroscopic images.  The wound was copiously irrigated.  The fascia was closed using #1 PDS.  The subcutaneous layer was closed using 0 Vicryl followed by 2-0 Vicryl, and the skin was closed using 4-0 Monocryl. Benzoin and Steri-Strips were applied followed by sterile dressing.    The patient was then rolled prone onto a Jackson spinal bed.  The back was then prepped and draped in the usual sterile fashion.  I then made paramedian incisions on the right and left sides, just lateral to the lateral borders of the pedicles from L5-S1.  On  the left side, the posterolateral gutter and posterior elements were identified and exposed and decorticated.  The remainder of the DBX mix was packed into the posterolateral gutter on the left side to aid in the success of the posterior fusion.  I then tapped the L5, and S1 pedicles bilaterally up to a 6 mm tap.  Of note, I did use neurologic monitoring and I  did test each of the taps using triggered EMG.  There was no tap that tested below 11 milliamps.  I then placed 7 x 45 mm screws bilaterally at S1 and 7 x 40 mm screws bilaterally at L5.  Rods were then secured into the tulip heads of the screws bilaterally.  Caps were then placed and a final locking procedure was performed.  I was very pleased with the final AP and lateral fluoroscopic images.  The wound was then copiously irrigated.  On the right and left sides, the fascia was closed using #1 Vicryl.  The subcutaneous layer was closed using 0 Vicryl followed by 2-0 Vicryl, and the skin was then closed using 4-0 Monocryl. Benzoin and Steri-Strips were applied followed by sterile dressing.  All instrument counts were correct at the termination of the procedure. Again, I did use neurologic monitoring throughout the surgery, and there was no abnormal EMG activity noted throughout the surgery.  Of note, Jason CoopKayla McKenzie was my assistant throughout surgery, and did aid in retraction, suctioning, and closure for both the anterior and posterior portions of the procedure.     Estill BambergMark Tywone Bembenek, MD

## 2019-01-31 NOTE — H&P (Signed)
PREOPERATIVE H&P  Chief Complaint: Low back pain  HPI: Linda Wiggins is a 33 y.o. 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 ADHD   . Anemia   . Anxiety   . Chlamydia   . Depression   . Hearing loss    75% hearing loss in left ear, no hearing aids  . Left leg pain   . Low back pain   . Migraines    otc med prn  . Numbness    Left leg  . SVD (spontaneous vaginal delivery)    x 3  . Vaginal Pap smear, abnormal    Past Surgical History:  Procedure Laterality Date  . ADENOIDECTOMY, TONSILLECTOMY AND MYRINGOTOMY WITH TUBE PLACEMENT    . HERNIA REPAIR    . LAPAROSCOPIC TUBAL LIGATION Bilateral 11/10/2017   Procedure: LAPAROSCOPIC TUBAL LIGATION WITH FILSHIE CLIPS;  Surgeon: Allie Bossierove, Myra C, MD;  Location: [facility];  Service: Gynecology;  Laterality: Bilateral;  . LEEP    . TONSILLECTOMY    . WISDOM TOOTH EXTRACTION     Social History   Socioeconomic History  . Marital status: Married    Spouse name: Not on file  . Number of children: Not on file  . Years of education: Not on file  . Highest education level: Not on file  Occupational History  . Not on file  Social Needs  . Financial resource strain: Not on file  . Food insecurity    Worry: Not on file    Inability: Not on file  . Transportation needs    Medical: Not on file    Non-medical: Not on file  Tobacco Use  . Smoking status: Former Smoker    Packs/day: 0.25    Years: 12.00    Pack years: 3.00    Types: Cigarettes    Quit date: 08/26/2017    Years since quitting: 1.4  . Smokeless tobacco: Never Used  Substance and Sexual Activity  . Alcohol use: Yes    Comment: social wine  . Drug use: No   . Sexual activity: Yes    Partners: Male    Birth control/protection: Condom  Lifestyle  . Physical activity    Days per week: Not on file    Minutes per session: Not on file  . Stress: Not on file  Relationships  . Social Musicianconnections    Talks on phone: Not on file    Gets together: Not on file    Attends religious service: Not on file    Active member of club or organization: Not on file    Attends meetings of clubs or organizations: Not on file    Relationship status: Not on file  Other Topics Concern  . Not on file  Social History Narrative  . Not on file   Family History  Problem Relation Age of Onset  . Cervical cancer Mother   . Heart murmur Father   . Skin cancer Maternal Grandmother   . Skin cancer Maternal Grandfather   . Skin cancer Paternal Grandmother   . Skin cancer Paternal Grandfather   . Heart disease Paternal Grandfather    Allergies  Allergen  Reactions  . Tramadol     "I become paralyzed"   Prior to Admission medications   Medication Sig Start Date End Date Taking? Authorizing Provider  acetaminophen-codeine (TYLENOL #3) 300-30 MG tablet Take 1-2 tablets by mouth every 6 (six) hours as needed for moderate pain.  01/07/19  Yes [provider]  ASHWAGANDHA PO Take 1,000 mg by mouth daily.   Yes [provider]  meloxicam (MOBIC) 15 MG tablet Take 15 mg by mouth daily.  11/16/18  Yes [provider]  methocarbamol (ROBAXIN) 500 MG tablet Take 500 mg by mouth every 6 (six) hours as needed for muscle spasms.  01/05/19  Yes [provider]  TURMERIC PO Take 1,000 mg by mouth daily.   Yes [provider]  Vitamin D-Vitamin K (VITAMIN K2-VITAMIN D3 PO) Take 1 tablet by mouth daily.   Yes [provider]  ibuprofen (ADVIL,MOTRIN) 600 MG tablet Take 1 tablet (600 mg total) by mouth every 6 (six) hours. Patient not taking: Reported on 01/21/2019 08/27/17   Caroline More, DO     All other systems have been  reviewed and were otherwise negative with the exception of those mentioned in the HPI and as above.  Physical Exam: Vitals:   01/31/19 0605  BP: 127/80  Pulse: 93  Resp: 18  Temp: 98 F (36.7 C)  SpO2: 99%    There is no height or weight on file to calculate BMI.  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

## 2019-01-31 NOTE — Anesthesia Postprocedure Evaluation (Signed)
Anesthesia Post Note  Patient: Shailey Butterbaugh  Procedure(s) Performed: LUMBAR 5 - SACRUM 1 ANTERIOR LUMBAR INTERBODY FUSION WITH POSTERIOR SPINAL FUSION WITH INSTRUMENTATION AND ALLOGRAFT (N/A ) LUMBAR 5-SACRUM 1 POSTERIOR SPINAL FUSION WITH INSTRUMENTATION AND ALLOGRAFT (N/A Spine Lumbar) ABDOMINAL EXPOSURE (N/A )     Patient location during evaluation: PACU Anesthesia Type: General Level of consciousness: awake and alert Pain management: pain level controlled Vital Signs Assessment: post-procedure vital signs reviewed and stable Respiratory status: spontaneous breathing, nonlabored ventilation, respiratory function stable and patient connected to nasal cannula oxygen Cardiovascular status: blood pressure returned to baseline and stable Postop Assessment: no apparent nausea or vomiting Anesthetic complications: no    Last Vitals:  Vitals:   01/31/19 1300 01/31/19 1328  BP: 132/83 135/70  Pulse: (!) 102 (!) 116  Resp: 16 18  Temp: (!) 36.2 C   SpO2: 100% 98%                 Effie Berkshire

## 2019-02-01 ENCOUNTER — Encounter (HOSPITAL_COMMUNITY): Payer: Self-pay | Admitting: Orthopedic Surgery

## 2019-02-01 MED ORDER — DIAZEPAM 5 MG PO TABS: 5.0000 mg | ORAL_TABLET | Freq: Four times a day (QID) | ORAL | 0 refills | Status: AC | PRN

## 2019-02-01 MED ORDER — OXYCODONE-ACETAMINOPHEN 5-325 MG PO TABS: 1.0000 | ORAL_TABLET | ORAL | 0 refills | Status: AC | PRN

## 2019-02-01 MED FILL — Thrombin (Recombinant) For Soln 20000 Unit: CUTANEOUS | Qty: 1 | Status: AC

## 2019-02-01 NOTE — Progress Notes (Signed)
Pt doing well. Pt given D/C instructions with verbal understanding. Rx's were sent into Pt's pharmacy by MD. Pt's incisions are clean and dry with no sign of infection. Pt's IV was removed prior to D/C. Pt D/C'd home via wheelchair per MD order. Pt is stable @ D/C and has no other needs at this time. Holli Humbles, RN

## 2019-02-01 NOTE — Evaluation (Signed)
Physical Therapy Evaluation and DISCHARGE Patient Details Name: Linda Wiggins MRN: [id number] DOB: 09/22/1985 Today's Date: 02/01/2019   History of Present Illness  33 yo female s/p Anterior lumbar interbody fusion, L5-S1 and Posterior spinal fusion, L5-S1  Clinical Impression  Patient is s/p above surgery resulting in the deficits listed below (see PT Problem List). Pt functioning at modified independence and with good understanding of precautions. Pt with excellent home set up and family support. Pt with no further acute PT needs at this time. PT SIGNING OFF. Please re-consult if needed in future.     Follow Up Recommendations No PT follow up;Supervision - Intermittent    Equipment Recommendations  None recommended by PT(pt has RW and shower seat at home)    Recommendations for Other Services       Precautions / Restrictions Precautions Precautions: Back Precaution Booklet Issued: Yes (comment) Precaution Comments: pt able to recall 3/3 back precuations, occasionaly v/c's to minimize twisting during function Restrictions Weight Bearing Restrictions: No      Mobility  Bed Mobility               General bed mobility comments: pt sitting EOB upon PT arrival, pt able to instruct PT on proper transfer technique  Transfers Overall transfer level: Needs assistance Equipment used: Rolling walker (2 wheeled) Transfers: Sit to/from Stand Sit to Stand: Modified independent (Device/Increase time)         General transfer comment: attempted to have pt push up from bed however pt reports "it hurts to bad, and pushes up from RW despite verbal cues not too  Ambulation/Gait Ambulation/Gait assistance: Modified independent (Device/Increase time) Gait Distance (Feet): 200 Feet Assistive device: None Gait Pattern/deviations: WFL(Within Functional Limits) Gait velocity: dec, however normal for surgery Gait velocity interpretation: 1.31 - 2.62 ft/sec, indicative of limited  community ambulator General Gait Details: verbal cues to isomentrically contract abdominal muscles to support back, good upright position  Stairs Stairs: Yes Stairs assistance: Min guard Stair Management: One rail Left;Step to pattern;Sideways Number of Stairs: 3(to mimic home set up) General stair comments: pt unable to ascend forwards, tolerated sideways leading with the R LE well  Wheelchair Mobility    Modified Rankin (Stroke Patients Only)       Balance Overall balance assessment: Mild deficits observed, not formally tested                                           Pertinent Vitals/Pain Pain Assessment: 0-10 Pain Score: 5  Pain Location: incision in back Pain Descriptors / Indicators: Sore Pain Intervention(s): Monitored during session    Home Living Family/patient expects to be discharged to:: Private residence Living Arrangements: Spouse/significant other Available Help at Discharge: Family;Available 24 hours/day(spouse whose home on workers comp, 5313, 7 and 1018 mo old kids) Type of Home: House Home Access: Stairs to enter Entrance Stairs-Rails: Doctor, general practiceight;Left Entrance Stairs-Number of Steps: 4 Home Layout: One level Home Equipment: Environmental consultantWalker - 2 wheels;Shower seat      Prior Function Level of Independence: Independent         Comments: works from home, sits at a desk all day, was nursing up until 3 days ago     Hand Dominance   Dominant Hand: Right    Extremity/Trunk Assessment   Upper Extremity Assessment Upper Extremity Assessment: Overall WFL for tasks assessed    Lower Extremity Assessment Lower Extremity  Assessment: Generalized weakness(due to surgery)    Cervical / Trunk Assessment Cervical / Trunk Assessment: Other exceptions Cervical / Trunk Exceptions: recent back surgery  Communication   Communication: No difficulties  Cognition Arousal/Alertness: Awake/alert Behavior During Therapy: WFL for tasks  assessed/performed Overall Cognitive Status: Within Functional Limits for tasks assessed                                        General Comments General comments (skin integrity, edema, etc.): VSS, pt able to don brace independently     Exercises     Assessment/Plan    PT Assessment Patent does not need any further PT services  PT Problem List         PT Treatment Interventions      PT Goals (Current goals can be found in the Care Plan section)  Acute Rehab PT Goals Patient Stated Goal: home today PT Goal Formulation: All assessment and education complete, DC therapy    Frequency     Barriers to discharge        Co-evaluation               AM-PAC PT "6 Clicks" Mobility  Outcome Measure Help needed turning from your back to your side while in a flat bed without using bedrails?: None Help needed moving from lying on your back to sitting on the side of a flat bed without using bedrails?: None Help needed moving to and from a bed to a chair (including a wheelchair)?: None Help needed standing up from a chair using your arms (e.g., wheelchair or bedside chair)?: None Help needed to walk in hospital room?: None Help needed climbing 3-5 steps with a railing? : A Little 6 Click Score: 23    End of Session Equipment Utilized During Treatment: Back brace Activity Tolerance: Patient tolerated treatment well Patient left: in bed;with call bell/phone within reach(sitting EOB) Nurse Communication: Mobility status PT Visit Diagnosis: Unsteadiness on feet (R26.81);Muscle weakness (generalized) (M62.81);Difficulty in walking, not elsewhere classified (R26.2)    Time: 2706-2376 PT Time Calculation (min) (ACUTE ONLY): 16 min   Charges:   PT Evaluation $PT Eval Low Complexity: 1 Low          Kittie Plater, PT, DPT Acute Rehabilitation Services Pager #: [phone] Office #: [phone]   Berline Lopes 02/01/2019, 9:02 AM

## 2019-02-01 NOTE — Progress Notes (Signed)
Looks great this morning.  Up walking in the halls. No nausea or vomiting. Reports no abdominal discomfort.  Moderate back soreness. Stable postop day 1.  Will not follow actively.  Please notify us if we can provide any further assistance

## 2019-02-01 NOTE — Evaluation (Signed)
Occupational Therapy Evaluation and Discharge Patient Details Name: Linda Wiggins MRN: [id number] DOB: 1985-11-11 Today's Date: 02/01/2019    History of Present Illness 33 yo female s/p Anterior lumbar interbody fusion, L5-S1 and Posterior spinal fusion, L5-S1   Clinical Impression   This 33 yo female admitted and underwent above presents to acute OT with all education completed, we will D/C from acute OT.    Follow Up Recommendations  No OT follow up;Supervision - Intermittent    Equipment Recommendations  None recommended by OT       Precautions / Restrictions Precautions Precautions: Back Precaution Booklet Issued: Yes (comment) Precaution Comments: Pt can have TLSO off for night time trips to bathroom and off in bed; otherwise needs brace on anytime she is up Restrictions Weight Bearing Restrictions: No      Mobility Bed Mobility               General bed mobility comments: pt up getting ready to go to bathroom  Transfers Overall transfer level: Modified independent Equipment used: Rolling walker (2 wheeled) Transfers: Sit to/from Stand Sit to Stand: Modified independent (Device/Increase time)                ADL either performed or assessed with clinical judgement   ADL                                         General ADL Comments: Educated pt on use of two cups for brushing teeth to avoid bending over sink, use of wet wipes for back peri care to get clean quicker with less twisting, side stepping into tub, cannot shower for 5 days, stance that can make it easier for sit<>stand, and how she would need to dress LB if family did not A her. Pt able to donn brace independently     Vision Patient Visual Report: No change from baseline              Pertinent Vitals/Pain Pain Assessment: 0-10 Pain Score: 5  Pain Location: incision in back Pain Descriptors / Indicators: Sore Pain Intervention(s): Monitored during session      Hand Dominance Right   Extremity/Trunk Assessment Upper Extremity Assessment Upper Extremity Assessment: Overall WFL for tasks assessed     Communication Communication Communication: No difficulties   Cognition Arousal/Alertness: Awake/alert Behavior During Therapy: WFL for tasks assessed/performed Overall Cognitive Status: Within Functional Limits for tasks assessed                                                Home Living Family/patient expects to be discharged to:: Private residence Living Arrangements: Spouse/significant other Available Help at Discharge: Family;Available 24 hours/day(spouse whose home on workers comp, 82, 73 and 43 mo old kids) Type of Home: House Home Access: Stairs to enter [employer] of Steps: 4 Entrance Stairs-Rails: [place]: One level     Bathroom Shower/Tub: Teacher, early years/pre: [place]: Environmental consultant - 2 wheels;Shower seat;Hand held shower head          Prior Functioning/Environment Level of Independence: Independent        Comments: works from home, sits at a desk all day, was nursing up until 3 days ago  OT Problem List: Decreased range of motion;Pain         OT Goals(Current goals can be found in the care plan section) Acute Rehab OT Goals Patient Stated Goal: home today  OT Frequency:                AM-PAC OT "6 Clicks" Daily Activity     Outcome Measure Help from another person eating meals?: None Help from another person taking care of personal grooming?: None Help from another person toileting, which includes using toliet, bedpan, or urinal?: None Help from another person bathing (including washing, rinsing, drying)?: A Little Help from another person to put on and taking off regular upper body clothing?: None Help from another person to put on and taking off regular lower body clothing?: A Little 6 Click Score: 22   End of  Session Equipment Utilized During Treatment: Back brace Nurse Communication: (pt asking about showering (answered) and breakfast (arrived on unit as I was leaving her room))  Activity Tolerance: Patient tolerated treatment well Patient left: Other (comment)(up in room)  OT Visit Diagnosis: Pain Pain - part of body: (back)                Time: [id number] OT Time Calculation (min): 11 min Charges:  OT General Charges $OT Visit: 1 Visit OT Evaluation $OT Eval Low Complexity: 1 Low  Ignacia Palmaathy Lucee Brissett, OTR/L [facility] Pager [phone] Office [phone]     Evette GeorgesLeonard, Amando Ishikawa Eva 02/01/2019, 9:20 AM

## 2019-02-01 NOTE — Discharge Instructions (Signed)
Spinal Fusion, Adult, Care After This sheet gives you information about how to care for yourself after your procedure. Your doctor may also give you more specific instructions. If you have problems or questions, contact your doctor. Follow these instructions at home: Medicines  Take over-the-counter and prescription medicines only as told by your doctor. These include any medicines for pain or blood-thinning medicines (anticoagulants).  If you were prescribed an antibiotic medicine, take it as told by your doctor. Do not stop taking the antibiotic even if you start to feel better.  Do not drive for 24 hours if you were given a medicine to help you relax (sedative) during your procedure.  Do not drive or use heavy machinery while taking prescription pain medicine. If you have a brace:  Wear the brace as told by your doctor. Take it off only as told by your doctor.  Keep the brace clean. Managing pain, stiffness, and swelling  If directed, put ice on the surgery area: ? If you have a removable brace, take it off as told by your doctor. ? Put ice in a plastic bag. ? Place a towel between your skin and the bag. ? Leave the ice on for 20 minutes, 2-3 times a day. Surgery cut care   Follow instructions from your doctor about how to take care of your cut from surgery (incision). Make sure you: ? Wash your hands with soap and water before you change your bandage (dressing). If you cannot use soap and water, use hand sanitizer. ? Change your bandage as told by your doctor. ? Leave stitches (sutures), skin glue, or skin tape (adhesive) strips in place. They may need to stay in place for 2 weeks or longer. If tape strips get loose and curl up, you may trim the loose edges. Do not remove tape strips completely unless your doctor says it is okay.  Keep your cut from surgery clean and dry. ? Do not take baths, swim, or use a hot tub until your doctor says it is okay. ? Ask your doctor if you can  take showers. You may only be allowed to take sponge baths.  Every day, check your cut from surgery and the area around it for: ? More redness, swelling, or pain. ? Fluid or blood. ? Warmth. ? Pus or a bad smell.  If you have a drain tube, follow instructions from your doctor about caring for it. Do not take out the drain tube or any bandages unless your doctor says it is okay. Physical activity  Rest and protect your back as much as possible.  Follow instructions from your doctor about how to move. Use good posture to help your spine heal.  Do not lift anything that is heavier than 8 lb (3.6 kg), or the limit that you are told, until your doctor says that it is safe.  Do not twist or bend at the waist until your doctor says it is okay.  It is best if you: ? Do not make pushing and pulling motions. ? Do not sit or lie down in the same position for a long time. ? Do not raise your hands or arms above your head.  Return to your normal activities as told by your doctor. Ask your doctor what activities are safe for you. Rest and protect your back as much as you can.  Do not start to exercise until your doctor says it is okay. Ask your doctor what kinds of exercise you can do   to make your back stronger. General instructions  To prevent blood clots and lessen swelling in your legs: ? Wear compression stockings as told. ? Walk one or more times every few hours as told by your doctor.  Do not use any products that contain nicotine or tobacco, such as cigarettes and e-cigarettes. These can delay bone healing. If you need help quitting, ask your doctor.  To prevent or treat constipation while you are taking prescription pain medicine, your doctor may suggest that you: ? Drink enough fluid to keep your pee (urine) pale yellow. ? Take over-the-counter or prescription medicines. ? Eat foods that are high in fiber. These include fresh fruits and vegetables, whole grains, and beans. ? Limit  foods that are high in fat and processed sugars, such as fried and sweet foods.  Keep all follow-up visits as told by your doctor. This is important. Contact a doctor if:  Your pain gets worse.  Your medicine does not help your pain.  Your legs or feet get painful or swollen.  Your cut from surgery is more red, swollen, or painful.  Your cut from surgery feels warm to the touch.  You have: ? Fluid or blood coming from your cut from surgery. ? Pus or a bad smell coming from your cut from surgery. ? A fever. ? Weakness or loss of feeling (numbness) in your legs that is new or getting worse. ? Trouble controlling when you pee (urinate) or poop (have a bowel movement).  You feel sick to your stomach (nauseous).  You throw up (vomit). Get help right away if:  Your pain is very bad.  You have chest pain.  You have trouble breathing.  You start to have a cough. These symptoms may be an emergency. Do not wait to see if the symptoms will go away. Get medical help right away. Call your local emergency services (911 in the [place] Do not drive yourself to the hospital. Summary  After the procedure, it is common to have pain in your back and pain by your surgery cut(s).  Icing and pain medicines may help to control the pain. Follow directions from your doctor.  Rest and protect your back as much as possible. Do not twist or bend at the waist.  Get up and walk one or more times every few hours as told by your doctor. This information is not intended to replace advice given to you by your health care provider. Make sure you discuss any questions you have with your health care provider. Document Released: 11/14/2010 Document Revised: 11/11/2018 Document Reviewed: 11/04/2016 Elsevier Patient Education  2020 [employer]  

## 2019-02-01 NOTE — Progress Notes (Signed)
    Patient doing well  Has been ambulating Denies leg pain   Physical Exam: Vitals:   01/31/19 2359 02/01/19 0541  BP: 115/78 (!) 105/49  Pulse: 88 85  Resp: 16 16  Temp: 98.1 F (36.7 C) 97.9 F (36.6 C)  SpO2: 100% 98%    Dressing in place NVI  POD #1 s/p L5/S1 AP fusion, doing well  - up with PT/OT, encourage ambulation - Percocet for pain, Valium for muscle spasms - d/c home today with f/u in 2 weeks

## 2019-02-03 MED FILL — Heparin Sodium (Porcine) Inj 1000 Unit/ML: INTRAMUSCULAR | Qty: 30 | Status: AC

## 2019-02-03 MED FILL — Sodium Chloride IV Soln 0.9%: INTRAVENOUS | Qty: 1000 | Status: AC

## 2019-02-03 NOTE — Discharge Summary (Signed)
Patient ID: Linda Wiggins MRN: [id number] DOB/AGE: 32/01/1986 32 y.o.  Admit date: 01/31/2019 Discharge date: 02/01/2019  Admission Diagnoses:  Active Problems:   Radiculopathy   Discharge Diagnoses:  Same  Past Medical History:  Diagnosis Date  . ADHD   . Anemia   . Anxiety   . Chlamydia   . Depression   . Hearing loss    75% hearing loss in left ear, no hearing aids  . Left leg pain   . Low back pain   . Migraines    otc med prn  . Numbness    Left leg  . SVD (spontaneous vaginal delivery)    x 3  . Vaginal Pap smear, abnormal     Surgeries: Procedure(s): LUMBAR 5 - SACRUM 1 ANTERIOR LUMBAR INTERBODY FUSION WITH POSTERIOR SPINAL FUSION WITH INSTRUMENTATION AND ALLOGRAFT LUMBAR 5-SACRUM 1 POSTERIOR SPINAL FUSION WITH INSTRUMENTATION AND ALLOGRAFT ABDOMINAL EXPOSURE on 01/31/2019   Consultants: Treatment Team:  Larina EarthlyEarly, Todd F, MD  Discharged Condition: Improved  Hospital Course: Linda [place] Keaton Valdes is an 33 y.o. female who was admitted 01/31/2019 for operative treatment of radiculopathy. Patient has severe unremitting pain that affects sleep, daily activities, and work/hobbies. After pre-op clearance the patient was taken to the operating room on 01/31/2019 and underwent  Procedure(s): LUMBAR 5 - SACRUM 1 ANTERIOR LUMBAR INTERBODY FUSION WITH POSTERIOR SPINAL FUSION WITH INSTRUMENTATION AND ALLOGRAFT LUMBAR 5-SACRUM 1 POSTERIOR SPINAL FUSION WITH INSTRUMENTATION AND ALLOGRAFT ABDOMINAL EXPOSURE.    Patient was given perioperative antibiotics:  Anti-infectives (From admission, onward)   Start     Dose/Rate Route Frequency Ordered Stop   01/31/19 1830  ceFAZolin (ANCEF) IVPB 2g/100 mL premix     2 g 200 mL/hr over 30 Minutes Intravenous Every 8 hours 01/31/19 1315 02/01/19 0150   01/31/19 0630  ceFAZolin (ANCEF) IVPB 2g/100 mL premix     2 g 200 mL/hr over 30 Minutes Intravenous On call to O.R. 01/31/19 0618 01/31/19 1139   01/31/19 0620  ceFAZolin  (ANCEF) 2-4 GM/100ML-% IVPB    Note to Pharmacy: Domingo DimesKotian, Sapna   : cabinet override      01/31/19 0620 01/31/19 0747       Patient was given sequential compression devices, early ambulation to prevent DVT.  Patient benefited maximally from hospital stay and there were no complications.    Recent vital signs: BP 121/71 (BP Location: Right Arm)   Pulse 61   Temp 97.8 F (36.6 C) (Oral)   Resp 18   LMP 01/26/2019   SpO2 100%    Discharge Medications:   Allergies as of 02/01/2019      Reactions   Tramadol    "I become paralyzed"      Medication List    STOP taking these medications   acetaminophen-codeine 300-30 MG tablet Commonly known as: TYLENOL #3     TAKE these medications   ASHWAGANDHA PO Take 1,000 mg by mouth daily.   diazepam 5 MG tablet Commonly known as: Valium Take 1 tablet (5 mg total) by mouth every 6 (six) hours as needed for anxiety.   methocarbamol 500 MG tablet Commonly known as: ROBAXIN Take 500 mg by mouth every 6 (six) hours as needed for muscle spasms.   oxyCODONE-acetaminophen 5-325 MG tablet Commonly known as: PERCOCET/ROXICET Take 1-2 tablets by mouth every 4 (four) hours as needed for moderate pain or severe pain.   VITAMIN K2-VITAMIN D3 PO Take 1 tablet by mouth daily.  Diagnostic Studies: Dg Lumbar Spine 2-3 Views  Addendum Date: 01/31/2019   ADDENDUM REPORT: 01/31/2019 11:35 ADDENDUM: Additional images have been submitted which show pedicle screws at L5 and S1 with posterior fixation in addition to the prior anterior fixation screw. Electronically Signed   By: Inez Catalina M.D.   On: 01/31/2019 11:35   Result Date: 01/31/2019 CLINICAL DATA:  L5-S1 fusion EXAM: DG C-ARM 61-120 MIN; LUMBAR SPINE - 2-3 VIEW COMPARISON:  MRI from 11/09/2018 FLUOROSCOPY TIME:  Radiation Exposure Index (as provided by the fluoroscopic device): Not available If the device does not provide the exposure index: Fluoroscopy Time:  49 seconds Number of  Acquired Images:  2 FINDINGS: Frontal and lateral views of the lumbar spine were obtained and reveal interbody fusion at L5-S1 with anterior fixation screws extending into the S1. IMPRESSION: L5-S1 fusion. Electronically Signed: By: Inez Catalina M.D. On: 01/31/2019 10:12   Dg C-arm 1-60 Min  Addendum Date: 01/31/2019   ADDENDUM REPORT: 01/31/2019 11:35 ADDENDUM: Additional images have been submitted which show pedicle screws at L5 and S1 with posterior fixation in addition to the prior anterior fixation screw. Electronically Signed   By: Inez Catalina M.D.   On: 01/31/2019 11:35   Result Date: 01/31/2019 CLINICAL DATA:  L5-S1 fusion EXAM: DG C-ARM 61-120 MIN; LUMBAR SPINE - 2-3 VIEW COMPARISON:  MRI from 11/09/2018 FLUOROSCOPY TIME:  Radiation Exposure Index (as provided by the fluoroscopic device): Not available If the device does not provide the exposure index: Fluoroscopy Time:  49 seconds Number of Acquired Images:  2 FINDINGS: Frontal and lateral views of the lumbar spine were obtained and reveal interbody fusion at L5-S1 with anterior fixation screws extending into the S1. IMPRESSION: L5-S1 fusion. Electronically Signed: By: Inez Catalina M.D. On: 01/31/2019 10:12   Dg Or Local Abdomen  Result Date: 01/31/2019 CLINICAL DATA:  Evaluate for retained foreign body. EXAM: OR LOCAL ABDOMEN COMPARISON:  MR 11/09/2018 FINDINGS: No retained instruments identified. There are postop changes from anterior disc fusion at L5-S1. Several surgical clips are identified in the left hemipelvis overlying the left side of sacrum. Tubal ligation clips noted. IMPRESSION: No retained surgical instruments identified. Electronically Signed   By: Kerby Moors M.D.   On: 01/31/2019 10:13    Disposition:    POD #1 s/p L5/S1 AP fusion, doing well  - up with PT/OT, encourage ambulation - Percocet for pain, Valium for muscle spasms - d/c home today with f/u in 2 weeks -D/C instructions sheet printed and in chart    Signed: Lennie Muckle Luka Reisch 02/03/2019, 10:16 AM

## 2019-06-08 ENCOUNTER — Other Ambulatory Visit (HOSPITAL_COMMUNITY)
Admission: RE | Admit: 2019-06-08 | Discharge: 2019-06-08 | Disposition: A | Discharge status: Home / Self Care | Payer: Medicaid Other | Source: Ambulatory Visit | Attending: Obstetrics & Gynecology | Admitting: Obstetrics & Gynecology

## 2019-06-08 ENCOUNTER — Other Ambulatory Visit: Payer: Self-pay

## 2019-06-08 ENCOUNTER — Ambulatory Visit: Payer: Medicaid Other | Admitting: Obstetrics & Gynecology

## 2019-06-08 VITALS — BP 110/79 | HR 109 | Ht 63.0 in | Wt 177.0 lb

## 2019-06-08 DIAGNOSIS — Z0000 Encounter for general adult medical examination without abnormal findings: Secondary | ICD-10-CM

## 2019-06-08 DIAGNOSIS — N921 Excessive and frequent menstruation with irregular cycle: Secondary | ICD-10-CM | POA: Diagnosis not present

## 2019-06-08 NOTE — Progress Notes (Signed)
   Subjective:    Patient ID: Linda Wiggins, female    DOB: 1985-11-26, 33 y.o.   MRN: [id number]  HPI 33 yo divorced P3 here because she reports that her periods have become irregular and heavy and painful since she had a laparoscopic application of Fil[place] clips on 11/10/17. She has a new partner for about 3 months, not using condoms. She denies dyspareunia.  Review of Systems     Objective:   Physical Exam Breathing, conversing, and ambulating normally Well nourished, well hydrated White female, no apparent distress Abd- benign Cervix- s/p LEEP, well-healed     Assessment & Plan:  Heavy, long, painful periods Check TSH, CBC, gyn u/s, STI testing She declined Gardasil and flu vaccines Pap smear with cotesting done today She declines medical medical manipulation of her periods (OCPs, megace, etc), would like to know the etiology first.

## 2019-06-08 NOTE — Progress Notes (Signed)
Pt states she started period 05/15/2019 and it hasn't ended

## 2019-06-09 LAB — CBC
HCT: 42.2 % (ref 35.0–45.0)
Hemoglobin: 14.9 g/dL (ref 11.7–15.5)
MCH: 32.7 pg (ref 27.0–33.0)
MCHC: 35.3 g/dL (ref 32.0–36.0)
MCV: 92.7 fL (ref 80.0–100.0)
MPV: 9.2 fL (ref 7.5–12.5)
Platelets: 368 10*3/uL (ref 140–400)
RBC: 4.55 10*6/uL (ref 3.80–5.10)
RDW: 13.1 % (ref 11.0–15.0)
WBC: 8.3 10*3/uL (ref 3.8–10.8)

## 2019-06-09 LAB — CERVICOVAGINAL ANCILLARY ONLY
Bacterial Vaginitis (gardnerella): NEGATIVE
Candida Glabrata: NEGATIVE
Candida Vaginitis: NEGATIVE
Chlamydia: NEGATIVE
Comment: NEGATIVE
Comment: NEGATIVE
Comment: NEGATIVE
Comment: NEGATIVE
Comment: NEGATIVE
Comment: NORMAL
Neisseria Gonorrhea: NEGATIVE
Trichomonas: NEGATIVE

## 2019-06-09 LAB — HEPATITIS C ANTIBODY
Hepatitis C Ab: NONREACTIVE
SIGNAL TO CUT-OFF: 0.02 (ref <1.00)

## 2019-06-09 LAB — RPR: RPR Ser Ql: NONREACTIVE

## 2019-06-09 LAB — HEPATITIS B SURFACE ANTIGEN: Hepatitis B Surface Ag: NONREACTIVE

## 2019-06-09 LAB — TSH: TSH: 1.08 mIU/L

## 2019-06-09 LAB — HIV ANTIBODY (ROUTINE TESTING W REFLEX): HIV 1&2 Ab, 4th Generation: NONREACTIVE

## 2019-06-10 ENCOUNTER — Other Ambulatory Visit: Payer: Self-pay

## 2019-06-10 ENCOUNTER — Ambulatory Visit (INDEPENDENT_AMBULATORY_CARE_PROVIDER_SITE_OTHER): Payer: Medicaid Other

## 2019-06-10 DIAGNOSIS — N921 Excessive and frequent menstruation with irregular cycle: Secondary | ICD-10-CM | POA: Diagnosis not present

## 2019-06-10 LAB — CYTOLOGY - PAP
Comment: NEGATIVE
Diagnosis: NEGATIVE
High risk HPV: NEGATIVE

## 2019-06-22 ENCOUNTER — Telehealth: Payer: Medicaid Other | Admitting: Obstetrics & Gynecology

## 2019-06-22 ENCOUNTER — Telehealth: Payer: Self-pay

## 2019-06-22 NOTE — Telephone Encounter (Signed)
Called pt to begin virtual visit. Pt did not answer. VM left asking pt to return call to office

## 2020-08-23 IMAGING — RF DG C-ARM 61-120 MIN
1 series · 4 of 4 positions shown · non-contrast
Comparison: MRI from 11/09/2018
COMPARISON: MRI from 11/09/2018

Addendum:
CLINICAL DATA: L5-S1 fusion

EXAM:
DG C-ARM 61-120 MIN; LUMBAR SPINE - 2-3 VIEW

[Series 1: run · 4 of 4 slices shown]
[im 1/4]
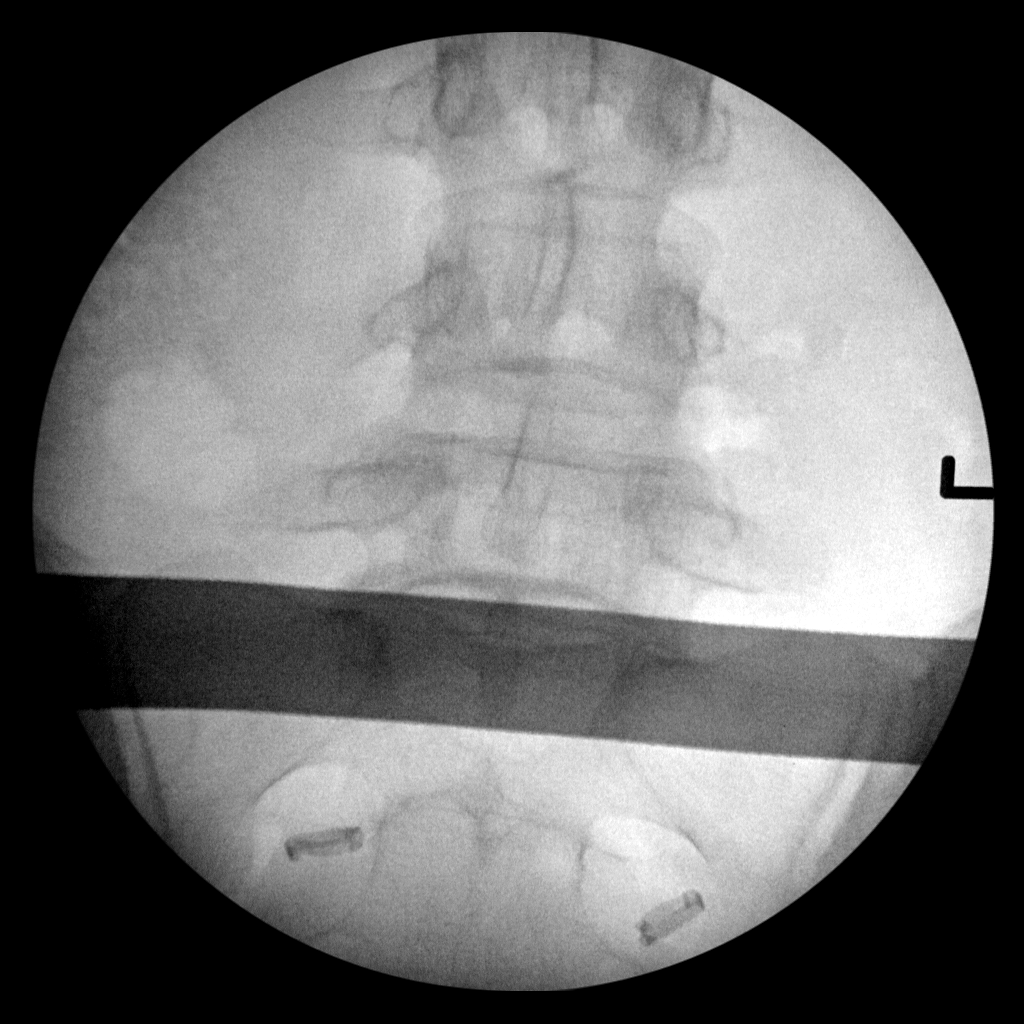
[im 2/4]
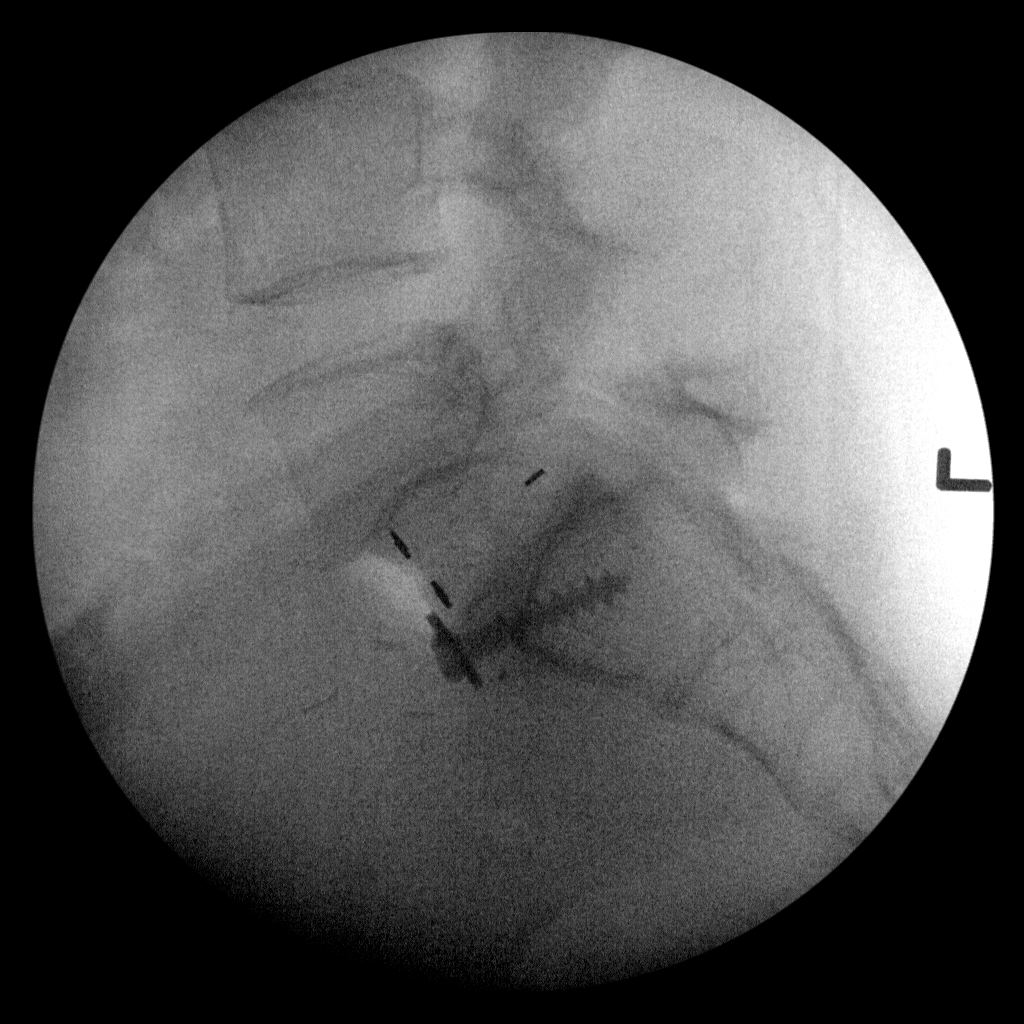
[im 3/4]
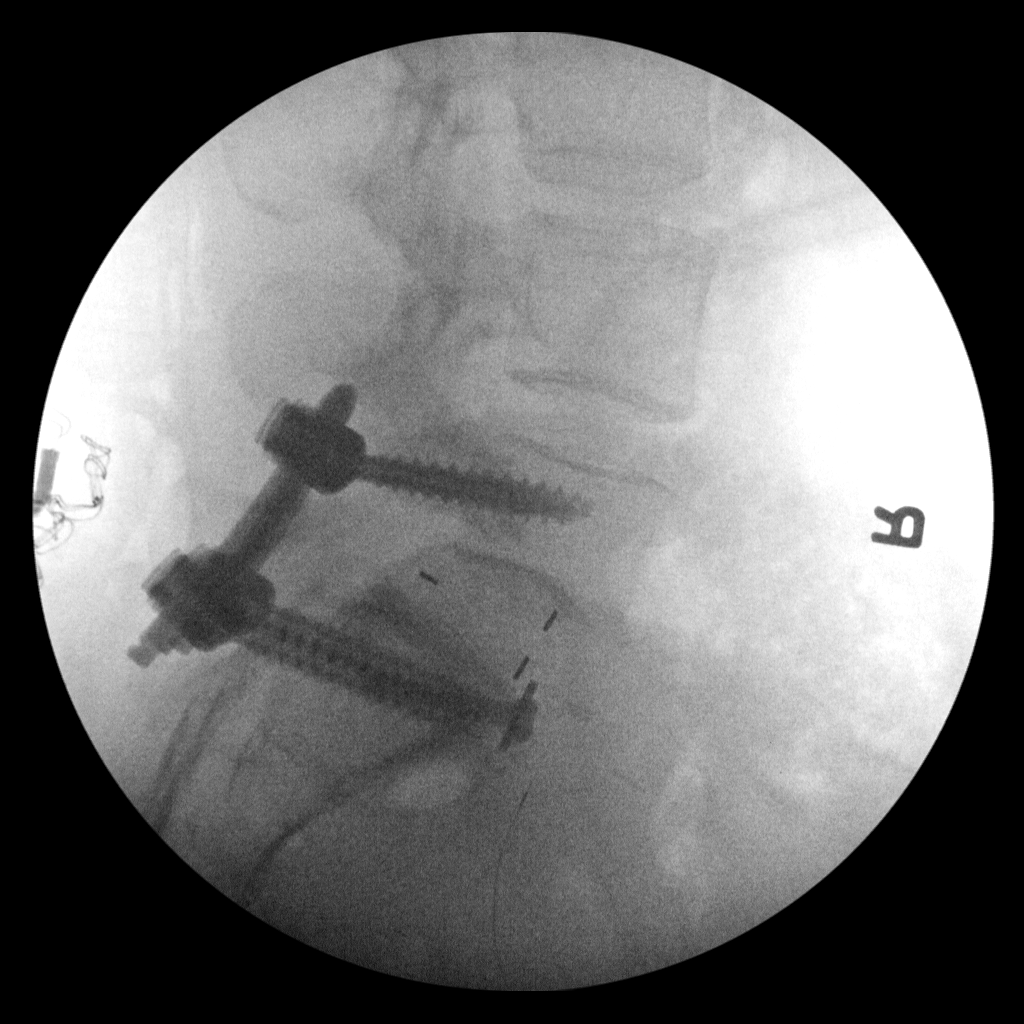
[im 4/4]
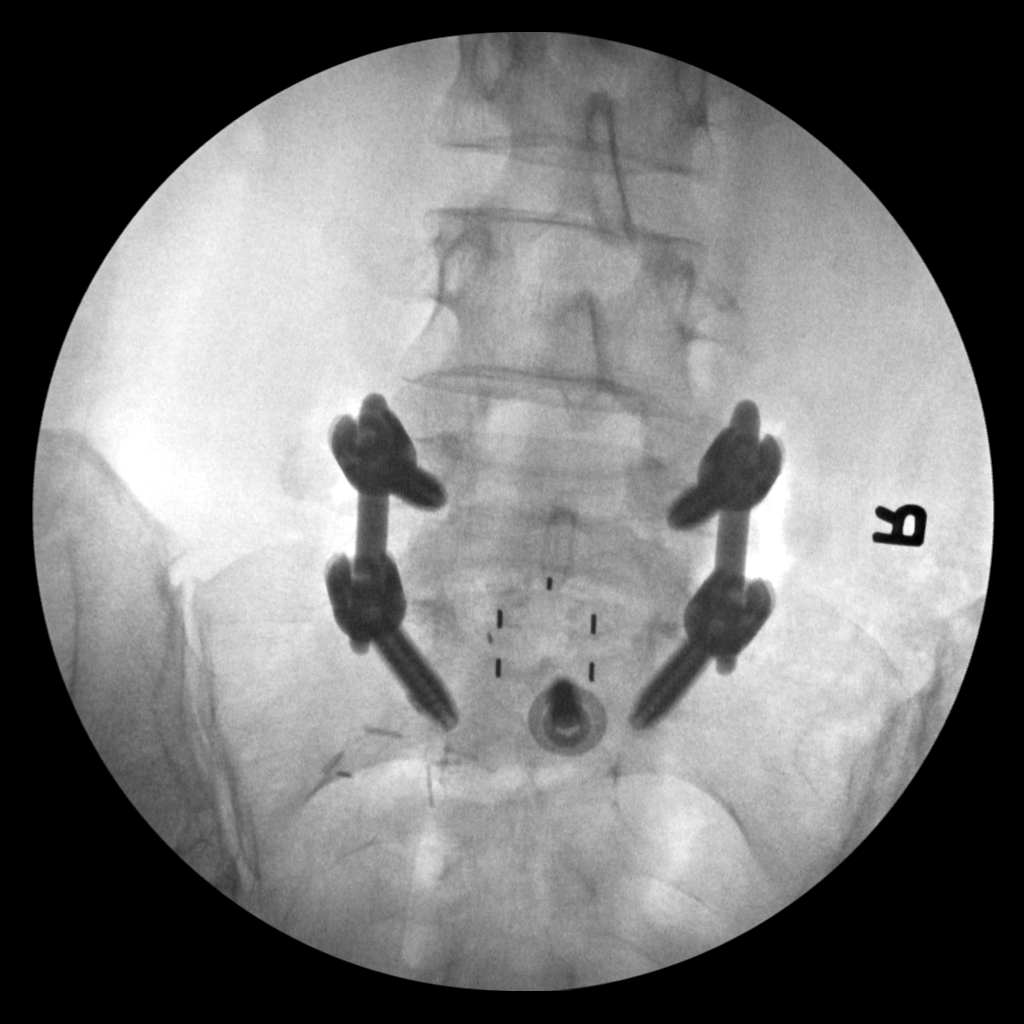

[4 of 4 positions shown; findings below may reference images not displayed]

FLUOROSCOPY TIME:  Radiation Exposure Index (as provided by the
fluoroscopic device): Not available

If the device does not provide the exposure index:

Fluoroscopy Time:  49 seconds

Number of Acquired Images:  2
FINDINGS: Frontal and lateral views of the lumbar spine were obtained and
reveal interbody fusion at L5-S1 with anterior fixation screws
extending into the S1.
IMPRESSION: L5-S1 fusion.

ADDENDUM:
Additional images have been submitted which show pedicle screws at
L5 and S1 with posterior fixation in addition to the prior anterior
fixation screw.

*** End of Addendum ***
FLUOROSCOPY TIME:  Radiation Exposure Index (as provided by the
fluoroscopic device): Not available

If the device does not provide the exposure index:

Fluoroscopy Time:  49 seconds

Number of Acquired Images:  2
FINDINGS: Frontal and lateral views of the lumbar spine were obtained and
reveal interbody fusion at L5-S1 with anterior fixation screws
extending into the S1.
IMPRESSION: L5-S1 fusion.

## 2020-12-31 IMAGING — US US TRANSVAGINAL NON-OB
1 series · 14 of 25 positions shown · non-contrast
Comparison: None

CLINICAL DATA: Heavy menses; LMP 05/15/2019

EXAM:
ULTRASOUND PELVIS TRANSVAGINAL
TECHNIQUE: Transvaginal ultrasound examination of the pelvis was performed
including evaluation of the uterus, ovaries, adnexal regions, and
pelvic cul-de-sac.

[Series 1: us transvaginal non-ob · 0.09mm/px · 138 acquisitions, 14 frames shown]
[im 1/138]
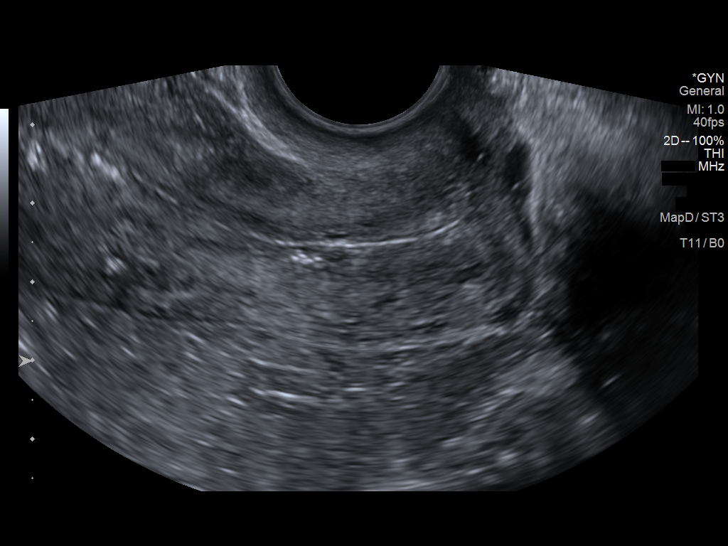
[im 12/138]
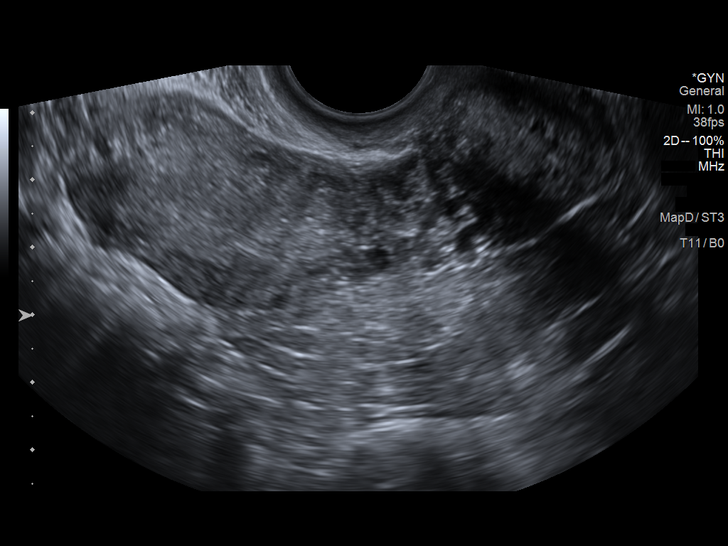
[im 23/138]
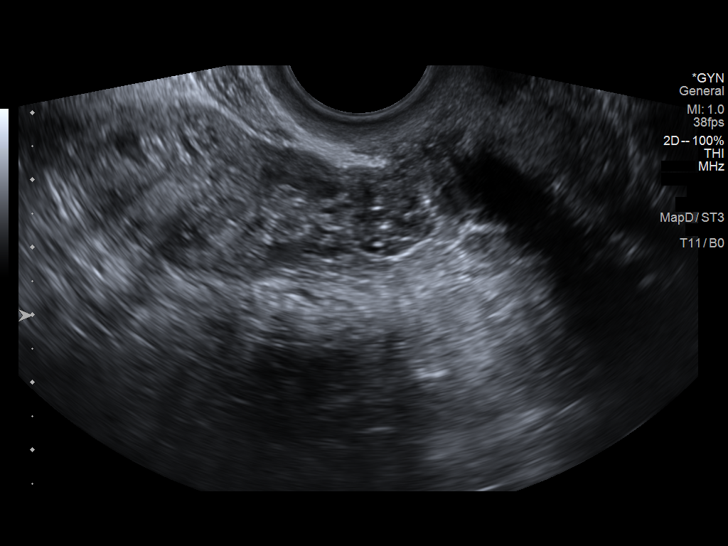
[im 35/138]
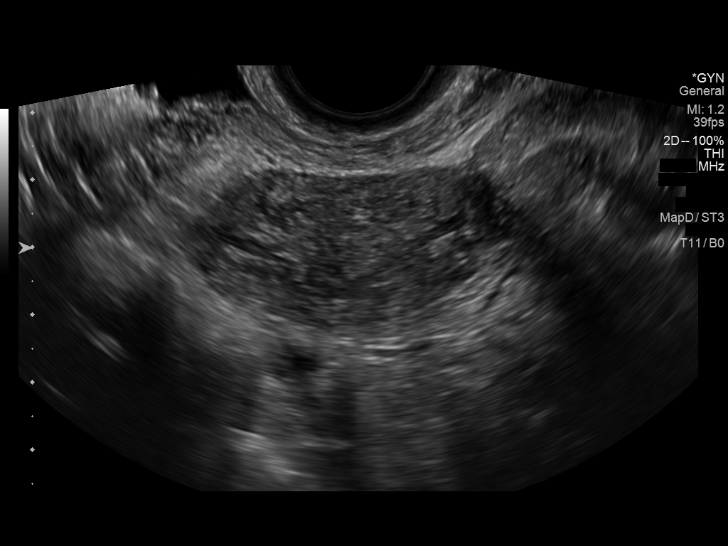
[im 46/138]
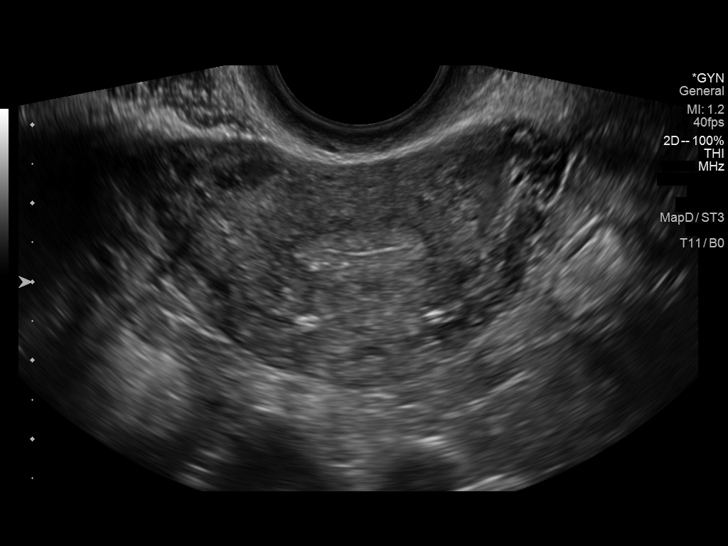
[im 52/138]
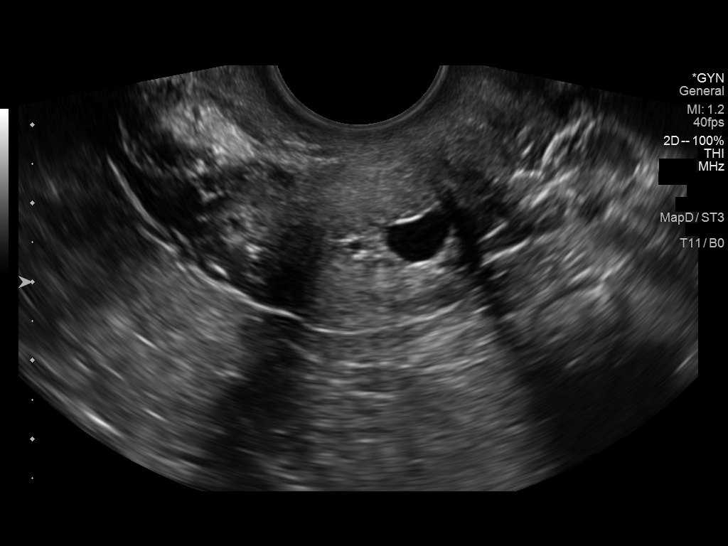
[im 63/138]
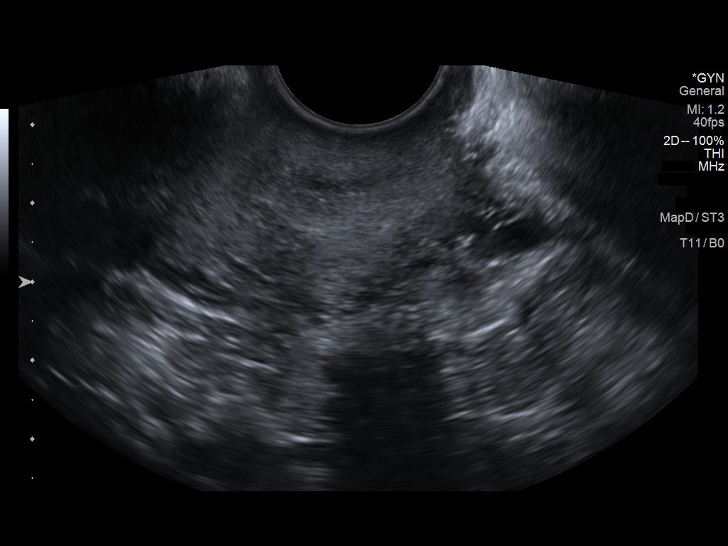
[im 75/138]
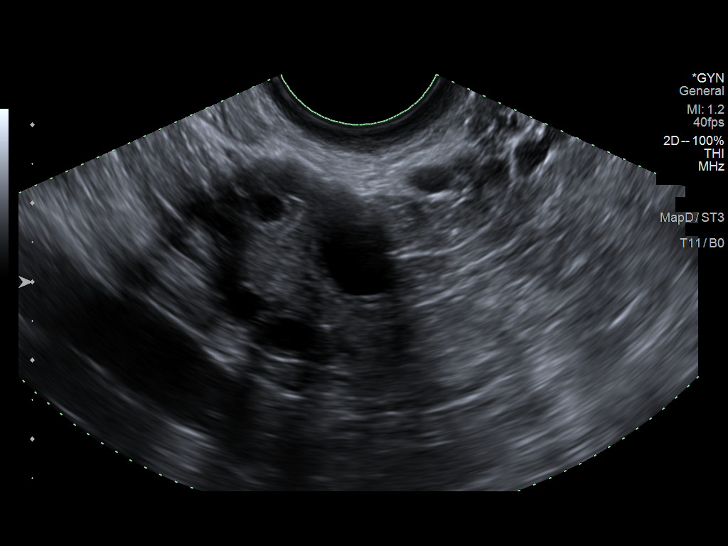
[im 86/138]
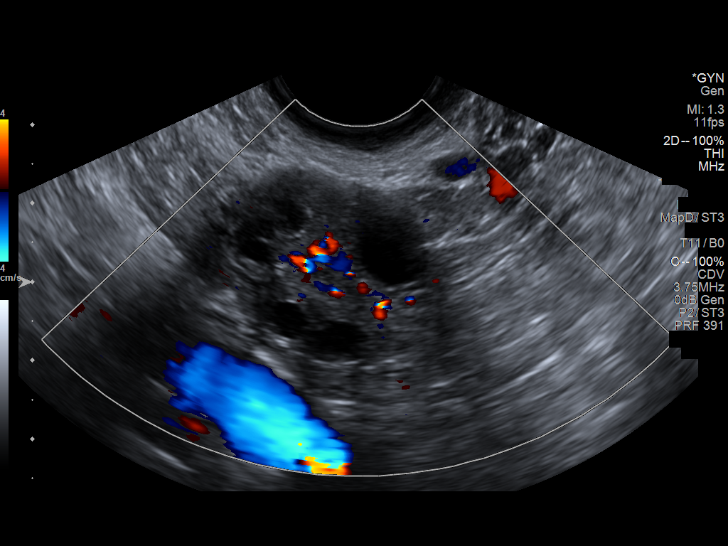
[im 92/138]
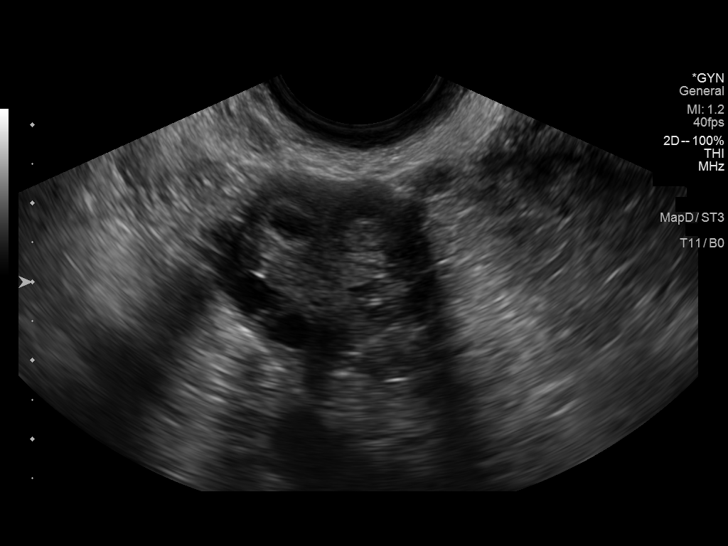
[im 103/138]
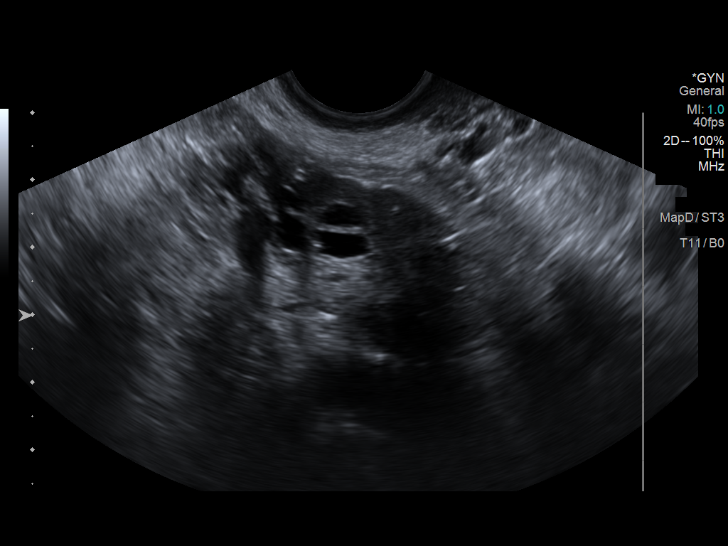
[im 115/138]
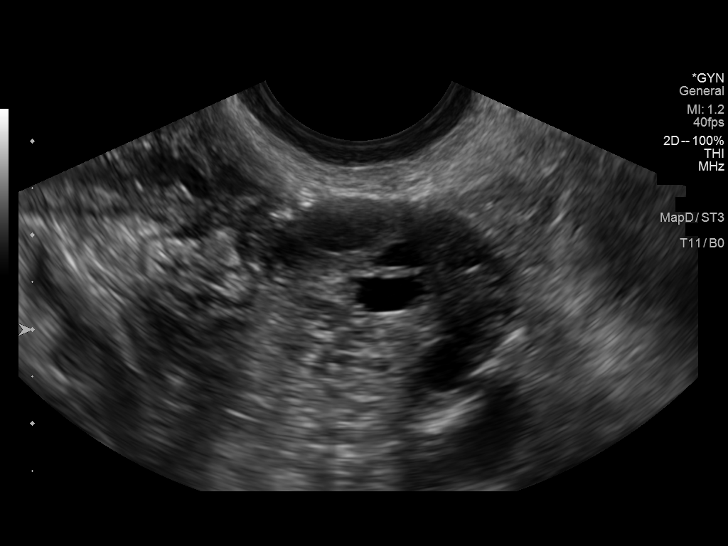
[im 126/138]
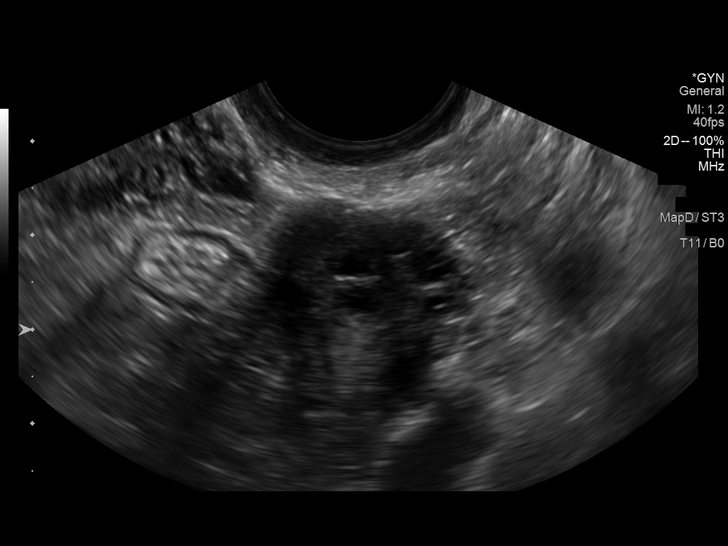
[im 138/138]
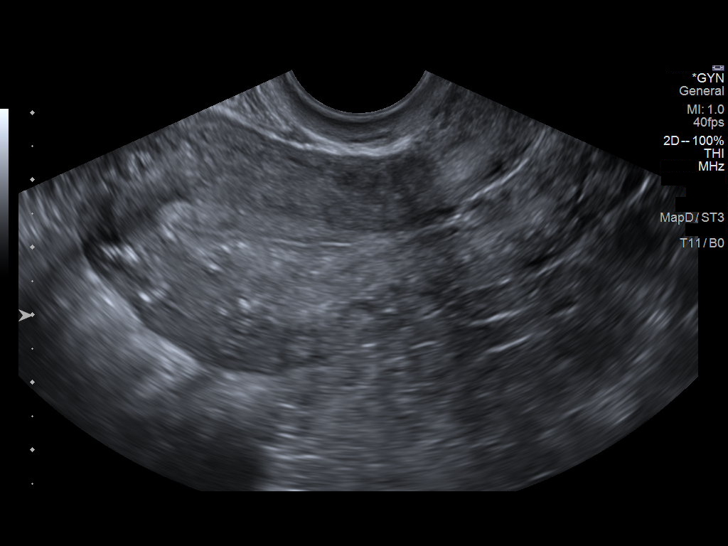

[14 of 25 positions shown; findings below may reference images not displayed]

FINDINGS: Uterus

Measurements: 8.2 x 3.7 x 5.1 cm = volume: 83 mL. Anteverted. Few
scattered calcifications within myometrium and at junction with
endometrial basal layer. No focal mass

Endometrium

Thickness: 7 mm.  No endometrial fluid

Right ovary

Measurements: 3.2 x 2.3 x 3.1 cm = volume: 12 mL. Normal morphology
without mass

Left ovary

Measurements: 2.7 x 2.1 x 2.4 cm = volume: 7 mL. Normal morphology
without mass

Other findings: No free pelvic fluid. No adnexal masses. Small
nabothian cyst at cervix.
IMPRESSION: No significant pelvic sonographic abnormalities.
